# Patient Record
Sex: Male | Born: 2000
Health system: Southern US, Community
[De-identification: ages and names within clinical notes are randomized; demographics above are authoritative.]

## PROBLEM LIST (undated history)

## (undated) DIAGNOSIS — F32A Depression, unspecified: Secondary | ICD-10-CM

## (undated) DIAGNOSIS — F419 Anxiety disorder, unspecified: Secondary | ICD-10-CM

## (undated) DIAGNOSIS — T7840XA Allergy, unspecified, initial encounter: Secondary | ICD-10-CM

## (undated) DIAGNOSIS — F329 Major depressive disorder, single episode, unspecified: Secondary | ICD-10-CM

## (undated) DIAGNOSIS — J45909 Unspecified asthma, uncomplicated: Secondary | ICD-10-CM

## (undated) HISTORY — DX: Unspecified asthma, uncomplicated: J45.909

## (undated) HISTORY — DX: Allergy, unspecified, initial encounter: T78.40XA

## (undated) HISTORY — DX: Major depressive disorder, single episode, unspecified: F32.9

## (undated) HISTORY — DX: Anxiety disorder, unspecified: F41.9

## (undated) HISTORY — DX: Depression, unspecified: F32.A

---
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
## 2020-01-12 DIAGNOSIS — F411 Generalized anxiety disorder: Secondary | ICD-10-CM | POA: Diagnosis not present

## 2020-01-12 DIAGNOSIS — F331 Major depressive disorder, recurrent, moderate: Secondary | ICD-10-CM | POA: Diagnosis not present

## 2020-01-25 IMAGING — US US SCROTUM W/ DOPPLER COMPLETE
1 series · 14 of 25 positions shown · non-contrast
Comparison: None.

CLINICAL DATA: Initial evaluation for acute right testicular pain
for 1 week.

EXAM:
SCROTAL ULTRASOUND
DOPPLER ULTRASOUND OF THE TESTICLES
TECHNIQUE: Complete ultrasound examination of the testicles, epididymis, and
other scrotal structures was performed. Color and spectral Doppler
ultrasound were also utilized to evaluate blood flow to the
testicles.

[Series 1: us scrotum w/ doppler complete · 0.06mm/px · 14 of 59 slices shown]
[im 1/59]
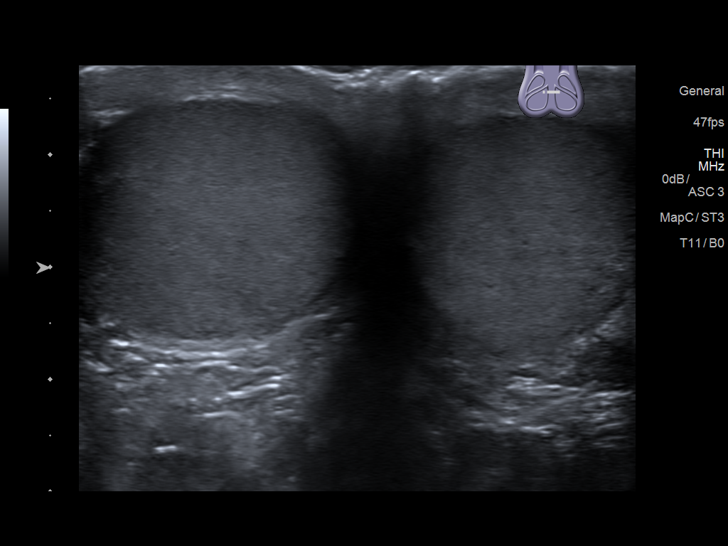
[im 5/59]
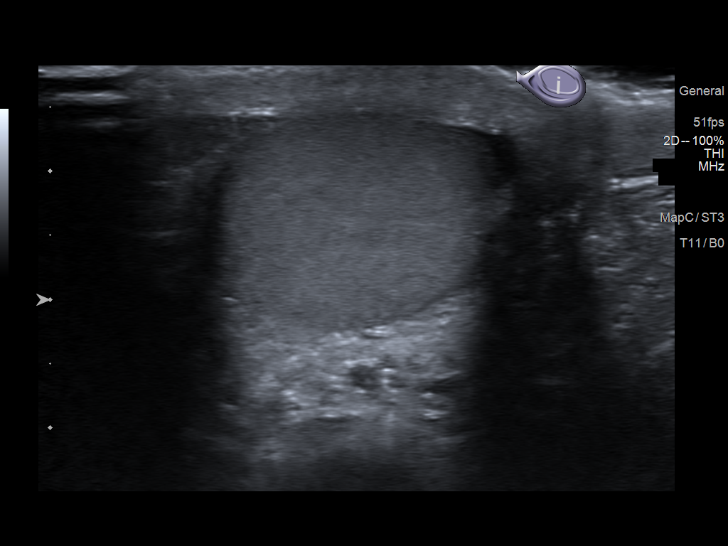
[im 10/59]
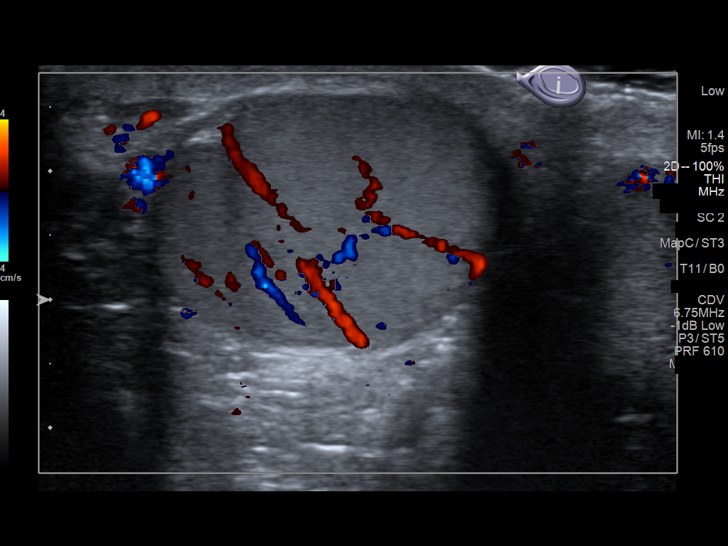
[im 15/59]
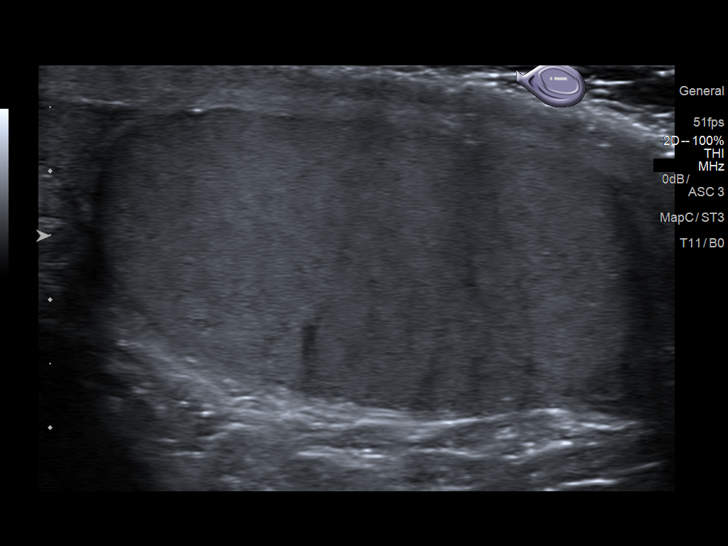
[im 20/59]
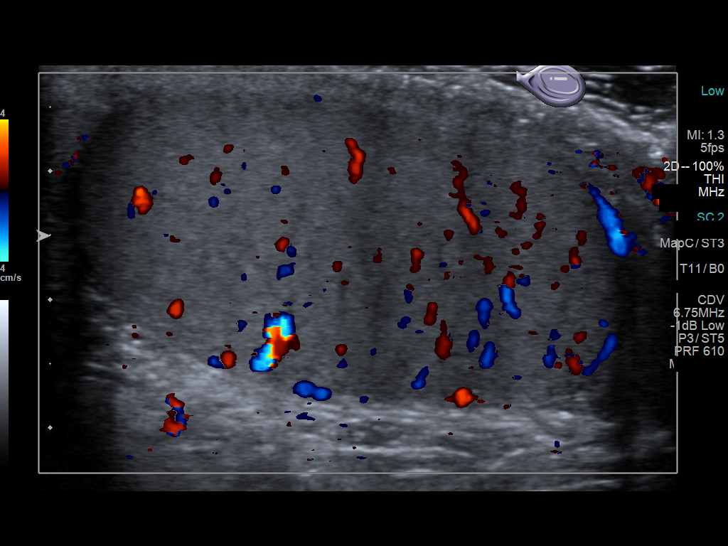
[im 22/59]
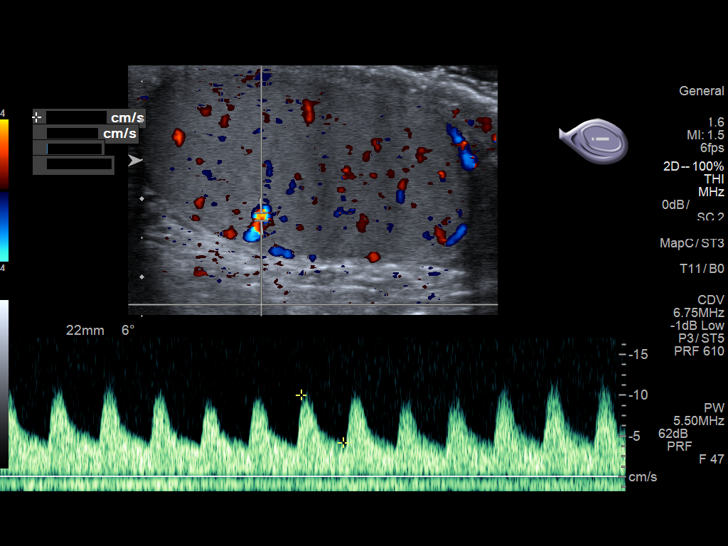
[im 27/59]
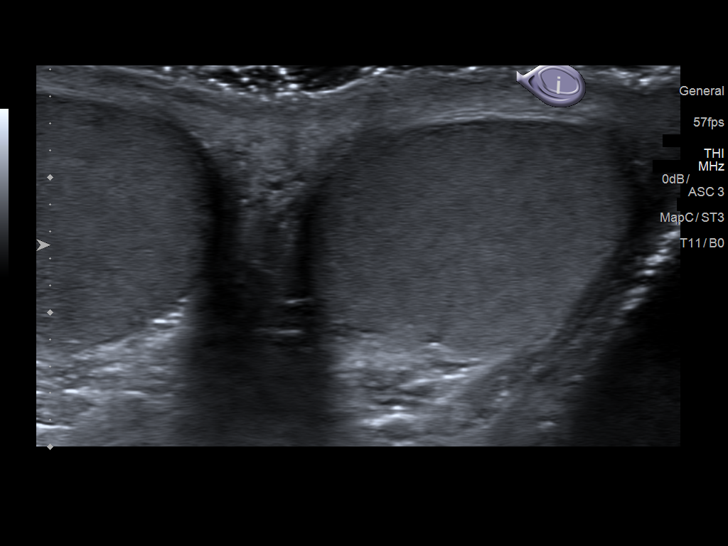
[im 32/59]
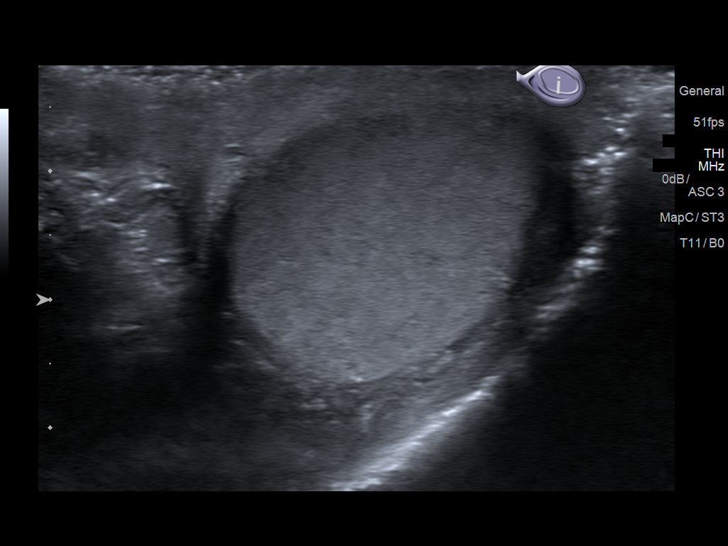
[im 37/59]
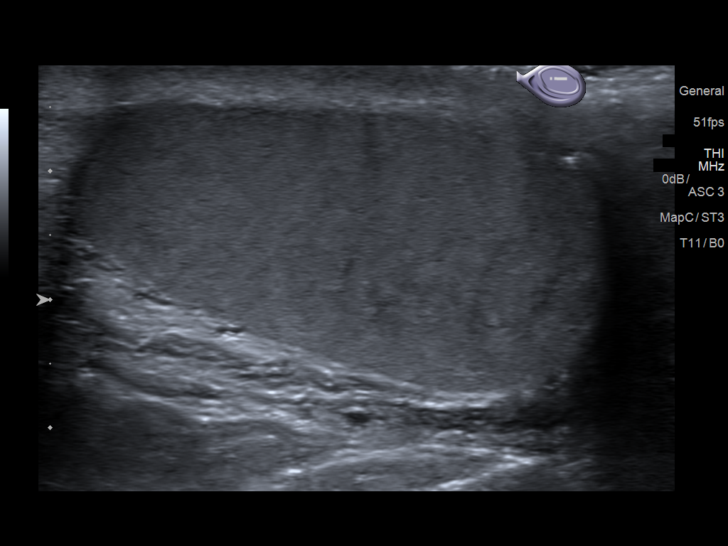
[im 39/59]
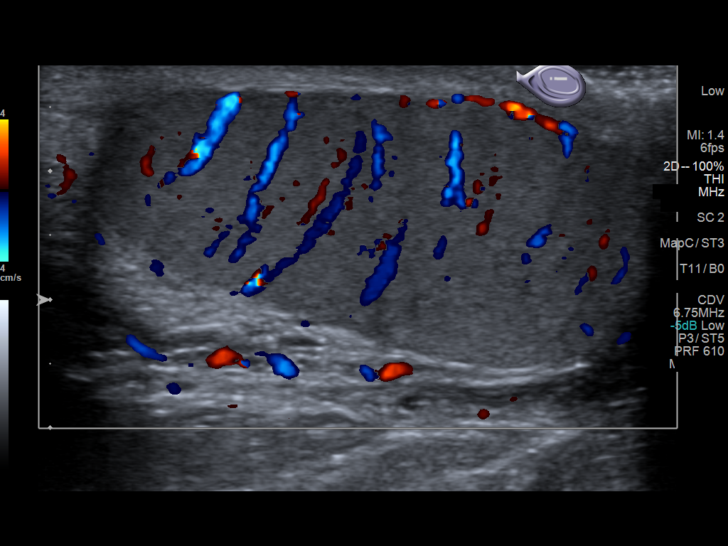
[im 44/59]
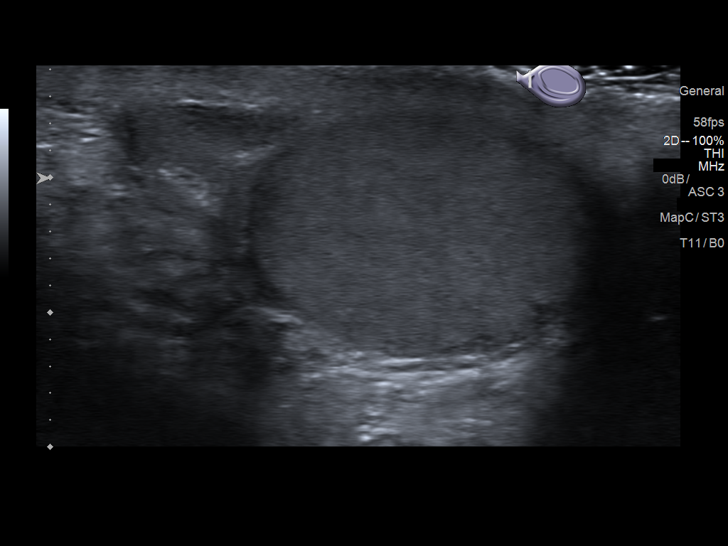
[im 49/59]
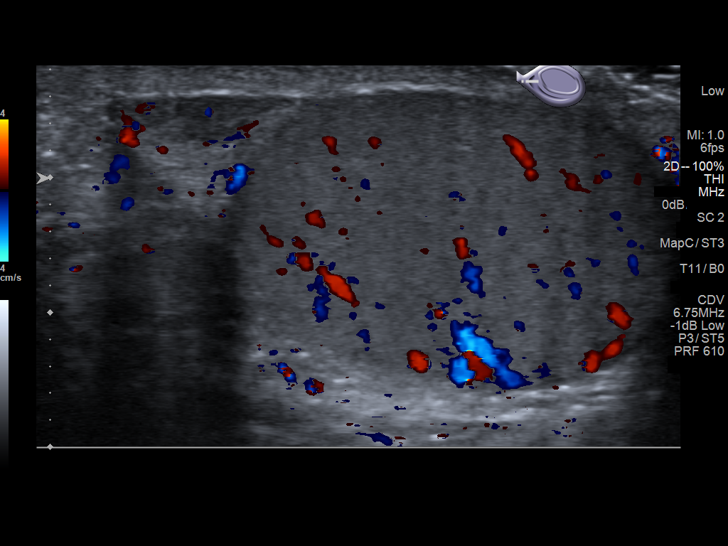
[im 54/59]
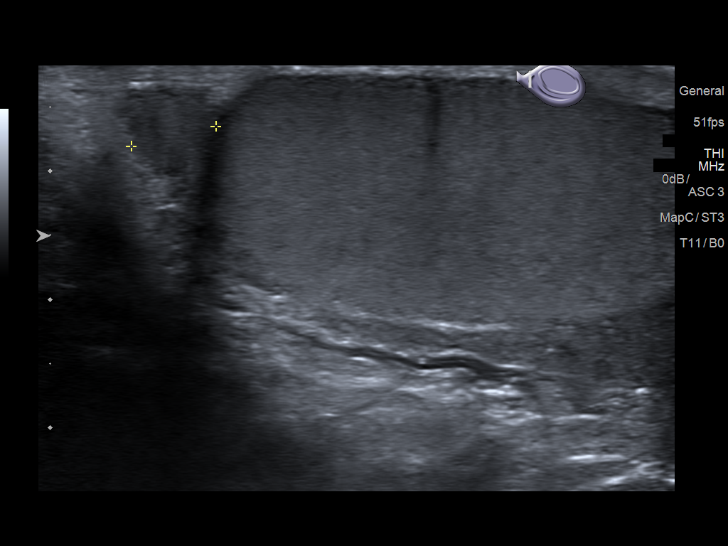
[im 59/59]
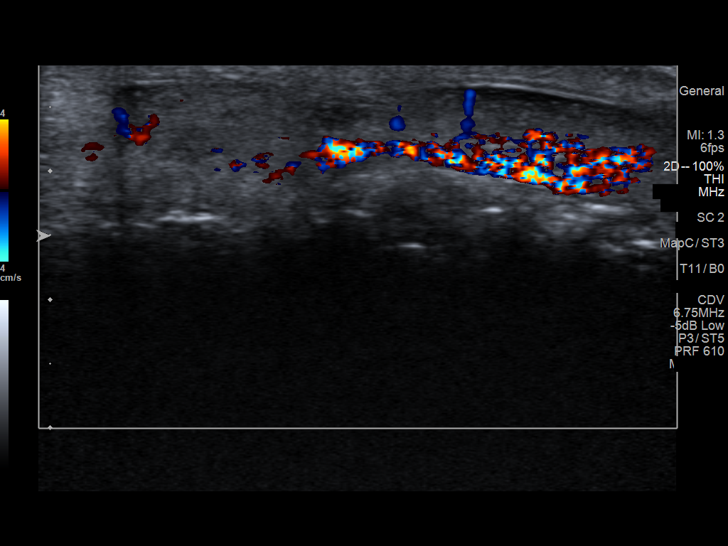

[14 of 25 positions shown; findings below may reference images not displayed]

FINDINGS: Right testicle

Measurements: 4.4 x 2.4 x 2.7 cm. No mass or microlithiasis
visualized.

Left testicle

Measurements: 4.2 x 2.1 x 2.7 cm. No mass or microlithiasis
visualized.

Right epididymis:  Normal in size and appearance.

Left epididymis:  Normal in size and appearance.

Hydrocele:  None visualized.

Varicocele:  None visualized.

Pulsed Doppler interrogation of both testes demonstrates normal low
resistance arterial and venous waveforms bilaterally.
IMPRESSION: Normal scrotal ultrasound.  No acute abnormality identified.

## 2020-03-17 DIAGNOSIS — F339 Major depressive disorder, recurrent, unspecified: Secondary | ICD-10-CM | POA: Diagnosis not present

## 2020-03-17 DIAGNOSIS — F411 Generalized anxiety disorder: Secondary | ICD-10-CM | POA: Diagnosis not present

## 2020-04-12 DIAGNOSIS — H5213 Myopia, bilateral: Secondary | ICD-10-CM | POA: Diagnosis not present

## 2020-10-19 DIAGNOSIS — Z68.41 Body mass index (BMI) pediatric, 85th percentile to less than 95th percentile for age: Secondary | ICD-10-CM | POA: Diagnosis not present

## 2020-10-19 DIAGNOSIS — F329 Major depressive disorder, single episode, unspecified: Secondary | ICD-10-CM | POA: Insufficient documentation

## 2020-10-19 DIAGNOSIS — Z0000 Encounter for general adult medical examination without abnormal findings: Secondary | ICD-10-CM | POA: Diagnosis not present

## 2020-10-19 DIAGNOSIS — F3341 Major depressive disorder, recurrent, in partial remission: Secondary | ICD-10-CM | POA: Diagnosis not present

## 2020-10-25 DIAGNOSIS — N433 Hydrocele, unspecified: Secondary | ICD-10-CM | POA: Diagnosis not present

## 2020-10-25 DIAGNOSIS — N50811 Right testicular pain: Secondary | ICD-10-CM | POA: Diagnosis not present

## 2020-10-30 DIAGNOSIS — N50811 Right testicular pain: Secondary | ICD-10-CM | POA: Diagnosis not present

## 2020-11-06 ENCOUNTER — Encounter: Payer: BC Managed Care – PPO | Admitting: Emergency Medicine

## 2020-11-08 DIAGNOSIS — F333 Major depressive disorder, recurrent, severe with psychotic symptoms: Secondary | ICD-10-CM | POA: Diagnosis not present

## 2020-11-16 DIAGNOSIS — F3341 Major depressive disorder, recurrent, in partial remission: Secondary | ICD-10-CM | POA: Diagnosis not present

## 2020-11-16 DIAGNOSIS — Z68.41 Body mass index (BMI) pediatric, 5th percentile to less than 85th percentile for age: Secondary | ICD-10-CM | POA: Diagnosis not present

## 2020-11-16 DIAGNOSIS — Z0000 Encounter for general adult medical examination without abnormal findings: Secondary | ICD-10-CM | POA: Diagnosis not present

## 2020-11-16 DIAGNOSIS — N50819 Testicular pain, unspecified: Secondary | ICD-10-CM | POA: Diagnosis not present

## 2020-11-16 DIAGNOSIS — N50811 Right testicular pain: Secondary | ICD-10-CM | POA: Diagnosis not present

## 2020-11-16 DIAGNOSIS — Z13220 Encounter for screening for lipoid disorders: Secondary | ICD-10-CM | POA: Diagnosis not present

## 2020-11-23 DIAGNOSIS — F411 Generalized anxiety disorder: Secondary | ICD-10-CM | POA: Diagnosis not present

## 2020-11-23 DIAGNOSIS — F332 Major depressive disorder, recurrent severe without psychotic features: Secondary | ICD-10-CM | POA: Diagnosis not present

## 2020-11-24 DIAGNOSIS — N50811 Right testicular pain: Secondary | ICD-10-CM | POA: Diagnosis not present

## 2020-11-24 DIAGNOSIS — N50819 Testicular pain, unspecified: Secondary | ICD-10-CM | POA: Diagnosis not present

## 2020-11-30 DIAGNOSIS — F411 Generalized anxiety disorder: Secondary | ICD-10-CM | POA: Diagnosis not present

## 2020-11-30 DIAGNOSIS — F332 Major depressive disorder, recurrent severe without psychotic features: Secondary | ICD-10-CM | POA: Diagnosis not present

## 2020-12-05 DIAGNOSIS — F3181 Bipolar II disorder: Secondary | ICD-10-CM | POA: Diagnosis not present

## 2020-12-12 DIAGNOSIS — F3181 Bipolar II disorder: Secondary | ICD-10-CM | POA: Diagnosis not present

## 2020-12-28 DIAGNOSIS — N50811 Right testicular pain: Secondary | ICD-10-CM | POA: Diagnosis not present

## 2020-12-28 DIAGNOSIS — F3181 Bipolar II disorder: Secondary | ICD-10-CM | POA: Diagnosis not present

## 2021-01-24 DIAGNOSIS — F3181 Bipolar II disorder: Secondary | ICD-10-CM | POA: Diagnosis not present

## 2021-01-26 DIAGNOSIS — F3181 Bipolar II disorder: Secondary | ICD-10-CM | POA: Diagnosis not present

## 2021-02-12 DIAGNOSIS — R1031 Right lower quadrant pain: Secondary | ICD-10-CM | POA: Diagnosis not present

## 2021-02-22 DIAGNOSIS — F3181 Bipolar II disorder: Secondary | ICD-10-CM | POA: Diagnosis not present

## 2021-03-26 DIAGNOSIS — F3181 Bipolar II disorder: Secondary | ICD-10-CM | POA: Diagnosis not present

## 2021-03-30 DIAGNOSIS — R1031 Right lower quadrant pain: Secondary | ICD-10-CM | POA: Diagnosis not present

## 2021-04-25 DIAGNOSIS — F3181 Bipolar II disorder: Secondary | ICD-10-CM | POA: Diagnosis not present

## 2021-05-07 ENCOUNTER — Telehealth: Payer: Self-pay | Admitting: Internal Medicine

## 2021-05-07 ENCOUNTER — Encounter: Payer: Self-pay | Admitting: Family Medicine

## 2021-05-07 ENCOUNTER — Telehealth (INDEPENDENT_AMBULATORY_CARE_PROVIDER_SITE_OTHER): Payer: BC Managed Care – PPO | Admitting: Family Medicine

## 2021-05-07 DIAGNOSIS — U071 COVID-19: Secondary | ICD-10-CM

## 2021-05-07 MED ORDER — PAXLOVID 20 X 150 MG & 10 X 100MG PO TBPK: 3.0000 | ORAL_TABLET | Freq: Two times a day (BID) | ORAL | 0 refills | Status: DC

## 2021-05-07 MED ORDER — HYDROCODONE BIT-HOMATROP MBR 5-1.5 MG PO TABS: 1.0000 | ORAL_TABLET | Freq: Three times a day (TID) | ORAL | 0 refills | Status: DC | PRN

## 2021-05-07 NOTE — Telephone Encounter (Addendum)
The patient contacted on call RN and requested to speak to a physician on call.  He stated that he was getting worse with COVID (sick since Saturday).  He was complaining of fever, cough and shortness of breath.  He was advised by RN to go to the emergency room.  He declined since he does not have a ride.  He lives with parents.  He requested me to sent a prescription for Paxlovid.  The patient is not dyspneic on the phone.  He does not sound to be in acute distress.  He had a virtual appointment earlier today.  I will send a paxlovid prescription to his pharmacy.  His parents or someone else will pick it up.  He is aware that the medication will not start working right away.  He was instructed to proceed to ER immediately if worse.  He can call ambulance if he has no ride available.  He understands.

## 2021-05-07 NOTE — Progress Notes (Signed)
[facility] [address] [place] [address] Dept: [phone] Dept Fax: [phone]  Virtual Video Visit  I connected with Melrose Nakayama on 05/07/21 at  4:00 PM EDT by a video enabled telemedicine application and verified that I am speaking with the correct person using two identifiers.  Location patient: Home Location provider: Clinic Persons participating in the virtual visit: Patient, Provider  I discussed the limitations of evaluation and management by telemedicine and the availability of in person appointments. The patient expressed understanding and agreed to proceed.  Chief Complaint  Patient presents with   Follow-up    Pt c/o fever, cough and congestion.    SUBJECTIVE:  HPI: Vincent Barnett is a 20 y.o. male who presents with a 3-day history of  fever (up to 102), rhinorrhea, vomiting (now resolved), and cough. He had a positive COVID test yesterday. He notes that on the weekend, he did experience some DOE when climbing stairs.  He has a past history of asthma, but this has not affected him recently.  Patient Active Problem List   Diagnosis Date Noted   MDD (major depressive disorder) 10/19/2020   History reviewed. No pertinent surgical history.  Family History  Problem Relation Age of Onset   Mental illness Mother    Heart disease Father    Mental illness Sister    Hypertension Maternal Grandmother    Colon cancer Maternal Grandmother    Hyperlipidemia Maternal Grandfather    Diabetes Paternal Grandmother    Hypertension Paternal Grandmother    Stroke Paternal Grandmother    Cancer Paternal Grandmother    Social History   Tobacco Use   Smoking status: Never   Smokeless tobacco: Never  Substance Use Topics   Drug use: Yes    Types: Marijuana    Comment: socially    Current Outpatient Medications:    ARIPiprazole (ABILIFY) 5 MG tablet, Take 5 mg by mouth daily., Disp: , Rfl:     busPIRone (BUSPAR) 15 MG tablet, Take 7.5 mg by mouth 2 (two) times daily., Disp: , Rfl:    escitalopram (LEXAPRO) 10 MG tablet, Take 10 mg by mouth daily., Disp: , Rfl:    HYDROcodone Bit-Homatrop MBr 5-1.5 MG TABS, Take 1 tablet by mouth every 8 (eight) hours as needed., Disp: 15 tablet, Rfl: 0  No Known Allergies  ROS: See pertinent positives and negatives per HPI.  OBSERVATIONS/OBJECTIVE:  VITALS per patient if applicable: There were no vitals filed for this visit. There is no height or weight on file to calculate BMI.   GENERAL: Alert and oriented. Appears well and in no acute distress.  HEENT: Atraumatic. Conjunctiva clear. No obvious abnormalities on inspection of external nose and ears.  NECK: Normal movements of the head and neck.  LUNGS: On inspection, no signs of respiratory distress. Breathing rate appears normal. No obvious gross SOB, gasping or wheezing, and no conversational dyspnea. Occasional coughing spell.  CV: No obvious cyanosis.  MS: Moves all visible extremities without noticeable abnormality.  PSYCH/NEURO: Pleasant and cooperative. No obvious depression or anxiety. Speech and thought processing grossly intact.  ASSESSMENT AND PLAN:  1. COVID-19 Reviewed home care instructions for COVID. Advised self-isolation at home for at least 5 days. After 5 days, if improved and fever resolved, can be in public, but should wear a mask around others for an additional 5 days. Discussed home care for viral illness, including rest, pushing fluids, and OTC medications as needed for symptom relief.  Recommend hot tea with honey for sore throat symptoms. In light of his dyspnea on exertion, I recommend he have someone pick up an oximeter for him. If his oxygen level is below 94%, I recommend he go to urgent care for evaluation.  If symptoms, esp, dyspnea worsens, recommend in-person evaluation at either an urgent care or the emergency room.  - HYDROcodone Bit-Homatrop MBr 5-1.5  MG TABS; Take 1 tablet by mouth every 8 (eight) hours as needed.  Dispense: 15 tablet; Refill: 0  I discussed the assessment and treatment plan with the patient. The patient was provided an opportunity to ask questions and all were answered. The patient agreed with the plan and demonstrated an understanding of the instructions.   The patient was advised to call back or seek an in-person evaluation if the symptoms worsen or if the condition fails to improve as anticipated.   Loyola Mast, MD

## 2021-05-07 NOTE — Patient Instructions (Signed)
10 Things You Can Do to Manage Your COVID-19 Symptoms at Home If you have possible or confirmed COVID-19 Stay home except to get medical care. Monitor your symptoms carefully. If your symptoms get worse, call your healthcare provider immediately. Get rest and stay hydrated. If you have a medical appointment, call the healthcare provider ahead of time and tell them that you have or may have COVID-19. For medical emergencies, call 911 and notify the dispatch personnel that you have or may have COVID-19. Cover your cough and sneezes with a tissue or use the inside of your elbow. Wash your hands often with soap and water for at least 20 seconds or clean your hands with an alcohol-based hand sanitizer that contains at least 60% alcohol. As much as possible, stay in a specific room and away from other people in your home. Also, you should use a separate bathroom, if available. If you need to be around other people in or outside of the home, wear a mask. Avoid sharing personal items with other people in your household, like dishes, towels, and bedding. Clean all surfaces that are touched often, like counters, tabletops, and doorknobs. Use household cleaning sprays or wipes according to the label instructions. cdc.gov/coronavirus 04/14/2020 This information is not intended to replace advice given to you by your health care provider. Make sure you discuss any questions you have with your healthcare provider. Document Revised: 11/03/2020 Document Reviewed: 11/03/2020 Elsevier Patient Education  2022 Elsevier Inc.  

## 2021-05-08 ENCOUNTER — Telehealth: Payer: Self-pay | Admitting: Emergency Medicine

## 2021-05-08 NOTE — Telephone Encounter (Signed)
05/07/2021.

## 2022-01-26 ENCOUNTER — Emergency Department (HOSPITAL_COMMUNITY): Payer: Commercial Managed Care - HMO

## 2022-01-26 ENCOUNTER — Encounter (HOSPITAL_COMMUNITY): Payer: Self-pay

## 2022-01-26 ENCOUNTER — Other Ambulatory Visit: Payer: Self-pay

## 2022-01-26 ENCOUNTER — Emergency Department (HOSPITAL_COMMUNITY)
Admission: EM | Admit: 2022-01-26 | Discharge: 2022-01-30 | Disposition: A | Discharge status: Home / Self Care | Payer: Commercial Managed Care - HMO | Attending: Emergency Medicine | Admitting: Emergency Medicine

## 2022-01-26 DIAGNOSIS — X820XXA Intentional collision of motor vehicle with other motor vehicle, initial encounter: Secondary | ICD-10-CM | POA: Diagnosis not present

## 2022-01-26 DIAGNOSIS — T1491XA Suicide attempt, initial encounter: Secondary | ICD-10-CM | POA: Diagnosis not present

## 2022-01-26 DIAGNOSIS — D72829 Elevated white blood cell count, unspecified: Secondary | ICD-10-CM | POA: Insufficient documentation

## 2022-01-26 DIAGNOSIS — U071 COVID-19: Secondary | ICD-10-CM | POA: Diagnosis not present

## 2022-01-26 DIAGNOSIS — Y900 Blood alcohol level of less than 20 mg/100 ml: Secondary | ICD-10-CM | POA: Insufficient documentation

## 2022-01-26 DIAGNOSIS — F332 Major depressive disorder, recurrent severe without psychotic features: Secondary | ICD-10-CM | POA: Diagnosis present

## 2022-01-26 DIAGNOSIS — F329 Major depressive disorder, single episode, unspecified: Secondary | ICD-10-CM | POA: Diagnosis present

## 2022-01-26 LAB — COMPREHENSIVE METABOLIC PANEL
ALT: 27 U/L (ref 0–44)
AST: 30 U/L (ref 15–41)
Albumin: 4.6 g/dL (ref 3.5–5.0)
Alkaline Phosphatase: 54 U/L (ref 38–126)
Anion gap: 7 (ref 5–15)
BUN: 10 mg/dL (ref 6–20)
CO2: 26 mmol/L (ref 22–32)
Calcium: 9.2 mg/dL (ref 8.9–10.3)
Chloride: 107 mmol/L (ref 98–111)
Creatinine, Ser: 1.22 mg/dL (ref 0.61–1.24)
GFR, Estimated: >60 mL/min (ref >60)
Glucose, Bld: 96 mg/dL (ref 70–99)
Potassium: 3.7 mmol/L (ref 3.5–5.1)
Sodium: 140 mmol/L (ref 135–145)
Total Bilirubin: 1.6 mg/dL — ABNORMAL HIGH (ref 0.3–1.2)
Total Protein: 7.6 g/dL (ref 6.5–8.1)

## 2022-01-26 LAB — CBC WITH DIFFERENTIAL/PLATELET
Abs Immature Granulocytes: 0.05 10*3/uL (ref 0.00–0.07)
Basophils Absolute: 0 10*3/uL (ref 0.0–0.1)
Basophils Relative: 0 %
Eosinophils Absolute: 0.1 10*3/uL (ref 0.0–0.5)
Eosinophils Relative: 1 %
HCT: 45 % (ref 39.0–52.0)
Hemoglobin: 15 g/dL (ref 13.0–17.0)
Immature Granulocytes: 0 %
Lymphocytes Relative: 4 %
Lymphs Abs: 0.5 10*3/uL — ABNORMAL LOW (ref 0.7–4.0)
MCH: 31.4 pg (ref 26.0–34.0)
MCHC: 33.3 g/dL (ref 30.0–36.0)
MCV: 94.3 fL (ref 80.0–100.0)
Monocytes Absolute: 1 10*3/uL (ref 0.1–1.0)
Monocytes Relative: 8 %
Neutro Abs: 11.2 10*3/uL — ABNORMAL HIGH (ref 1.7–7.7)
Neutrophils Relative %: 87 %
Platelets: 282 10*3/uL (ref 150–400)
RBC: 4.77 MIL/uL (ref 4.22–5.81)
RDW: 11.7 % (ref 11.5–15.5)
WBC: 12.8 10*3/uL — ABNORMAL HIGH (ref 4.0–10.5)
nRBC: 0 % (ref 0.0–0.2)

## 2022-01-26 LAB — SALICYLATE LEVEL: Salicylate Lvl: <7 mg/dL — ABNORMAL LOW (ref 7.0–30.0)

## 2022-01-26 LAB — ETHANOL: Alcohol, Ethyl (B): <10 mg/dL (ref <10)

## 2022-01-26 LAB — RAPID URINE DRUG SCREEN, HOSP PERFORMED
Amphetamines: NOT DETECTED
Barbiturates: NOT DETECTED
Benzodiazepines: NOT DETECTED
Cocaine: NOT DETECTED
Opiates: NOT DETECTED
Tetrahydrocannabinol: POSITIVE — AB

## 2022-01-26 LAB — ACETAMINOPHEN LEVEL: Acetaminophen (Tylenol), Serum: <10 ug/mL — ABNORMAL LOW (ref 10–30)
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
## 2022-01-27 LAB — RESP PANEL BY RT-PCR (FLU A&B, COVID) ARPGX2
Influenza A by PCR: NEGATIVE
Influenza B by PCR: NEGATIVE
SARS Coronavirus 2 by RT PCR: POSITIVE — AB

## 2022-01-27 LAB — LITHIUM LEVEL: Lithium Lvl: <0.06 mmol/L — ABNORMAL LOW (ref 0.60–1.20)

## 2022-01-27 MED ORDER — ARIPIPRAZOLE 5 MG PO TABS
5.0000 mg | ORAL_TABLET | Freq: Every day | ORAL | Status: DC
  Administered 2022-01-27 – 2022-01-30 (×4): 5 mg via ORAL
  Filled 2022-01-27 (×4): qty 1

## 2022-01-27 MED ORDER — LITHIUM CARBONATE ER 300 MG PO TBCR
300.0000 mg | EXTENDED_RELEASE_TABLET | Freq: Every day | ORAL | Status: DC
  Administered 2022-01-27 – 2022-01-30 (×4): 300 mg via ORAL
  Filled 2022-01-27 (×4): qty 1

## 2022-01-27 MED ORDER — ESCITALOPRAM OXALATE 10 MG PO TABS
20.0000 mg | ORAL_TABLET | Freq: Every day | ORAL | Status: DC
  Administered 2022-01-27 – 2022-01-30 (×4): 20 mg via ORAL
  Filled 2022-01-27 (×4): qty 2

## 2022-01-27 MED ORDER — CLONAZEPAM 0.125 MG PO TBDP
0.2500 mg | ORAL_TABLET | Freq: Every day | ORAL | Status: DC | PRN
  Administered 2022-01-27 – 2022-01-29 (×2): 0.5 mg via ORAL
  Filled 2022-01-27 (×2): qty 4

## 2022-01-27 MED ORDER — BUSPIRONE HCL 5 MG PO TABS
7.5000 mg | ORAL_TABLET | Freq: Two times a day (BID) | ORAL | Status: DC
  Administered 2022-01-27 – 2022-01-30 (×6): 7.5 mg via ORAL
  Filled 2022-01-27 (×7): qty 1.5

## 2022-01-27 MED ORDER — ACETAMINOPHEN 325 MG PO TABS
650.0000 mg | ORAL_TABLET | Freq: Four times a day (QID) | ORAL | Status: DC | PRN
  Administered 2022-01-27 (×2): 650 mg via ORAL
  Filled 2022-01-27 (×2): qty 2

## 2022-01-27 MED ORDER — GUAIFENESIN-DM 100-10 MG/5ML PO SYRP
5.0000 mL | ORAL_SOLUTION | ORAL | Status: DC | PRN
  Filled 2022-01-27: qty 10
  Filled 2022-01-27: qty 5
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
## 2022-01-27 NOTE — ED Notes (Signed)
Breakfast tray offered. Pt did not want it at this time.  ?

## 2022-01-28 DIAGNOSIS — F332 Major depressive disorder, recurrent severe without psychotic features: Secondary | ICD-10-CM | POA: Diagnosis present

## 2022-01-28 DIAGNOSIS — T1491XA Suicide attempt, initial encounter: Secondary | ICD-10-CM | POA: Insufficient documentation

## 2022-01-28 MED ORDER — TRAZODONE HCL 100 MG PO TABS
100.0000 mg | ORAL_TABLET | Freq: Every day | ORAL | Status: DC
  Administered 2022-01-29 (×2): 100 mg via ORAL
  Filled 2022-01-28 (×3): qty 1

## 2022-01-28 MED ORDER — LORAZEPAM 1 MG PO TABS
1.0000 mg | ORAL_TABLET | Freq: Once | ORAL | Status: AC
  Administered 2022-01-28: 1 mg via ORAL
  Filled 2022-01-28: qty 1
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
## 2022-01-29 DIAGNOSIS — T1491XA Suicide attempt, initial encounter: Secondary | ICD-10-CM

## 2022-01-29 MED ORDER — ONDANSETRON 4 MG PO TBDP
4.0000 mg | ORAL_TABLET | Freq: Three times a day (TID) | ORAL | Status: DC | PRN
  Administered 2022-01-29: 4 mg via ORAL
  Filled 2022-01-29: qty 1

## 2022-01-29 MED ORDER — LORAZEPAM 1 MG PO TABS
1.0000 mg | ORAL_TABLET | Freq: Four times a day (QID) | ORAL | Status: DC | PRN
Start: 2022-01-29 — End: 2022-01-30
  Administered 2022-01-29: 1 mg via ORAL
  Filled 2022-01-29: qty 1

## 2022-01-29 NOTE — Consult Note (Signed)
[facility] Psych ED Progress Note ? ?01/29/2022 12:21 PM ?Vincent Barnett  ?MRN:  [id number] ? ? ?Method of visit?: Face to Face  ? ?Subjective:  21 y.o.  Caucasian male with  past psychiatric history significant for major depressive disorder  and anxiety  presented to [facility] ED, accompanied by his mother Rodolfo Gaster), after attempting to kill himself after he got in the middle of his parents arguing.    ?He was seen in his  room resting.  Nursing reported no behavioral issues noted.  He continues to take his medications and today he reported that he has had time to reflect on what he did and if he could have done things differently.  He is very much interested in family therapy.  Provider spoke to his mother this morning regarding possible discharge tomorrow and the need to engage in Family therapy.  Mom is also much interested in that.  He denied SI/HI/AVH and no mention of paranoia. ?Principal Problem: Major depressive disorder, recurrent severe without psychotic features (HCC) ?Diagnosis:  Principal Problem: ?  Major depressive disorder, recurrent severe without psychotic features (HCC) ?Active Problems: ?  MDD (major depressive disorder) ? ?Total Time spent with patient: 20 minutes ? ?Past Psychiatric History: Depression, anxiety ? ?Past Medical History:  ?Past Medical History:  ?Diagnosis Date  ? Allergy   ? Anxiety   ? Asthma   ? Depression   ? History reviewed. No pertinent surgical history. ?Family History:  ?Family History  ?Problem Relation Age of Onset  ? Mental illness Mother   ? Heart disease Father   ? Mental illness Sister   ? Hypertension Maternal Grandmother   ? Colon cancer Maternal Grandmother   ? Hyperlipidemia Maternal Grandfather   ? Diabetes Paternal Grandmother   ? Hypertension Paternal Grandmother   ? Stroke Paternal Grandmother   ? Cancer Paternal Grandmother   ? ?Family Psychiatric  History: Mental illness in his mother and sister ?Social History:  ?Social History  ? ?Substance and  Sexual Activity  ?Alcohol Use None  ?   ?Social History  ? ?Substance and Sexual Activity  ?Drug Use Yes  ? Types: Marijuana  ? Comment: socially  ?  ?Social History  ? ?Socioeconomic History  ? Marital status: Single  ?  Spouse name: Not on file  ? Number of children: Not on file  ? Years of education: Not on file  ? Highest education level: Not on file  ?Occupational History  ? Not on file  ?Tobacco Use  ? Smoking status: Never  ? Smokeless tobacco: Never  ?Substance and Sexual Activity  ? Alcohol use: Not on file  ? Drug use: Yes  ?  Types: Marijuana  ?  Comment: socially  ? Sexual activity: Not on file  ?Other Topics Concern  ? Not on file  ?Social History Narrative  ? Not on file  ? ?Social Determinants of Health  ? ?Financial Resource Strain: Not on file  ?Food Insecurity: Not on file  ?Transportation Needs: Not on file  ?Physical Activity: Not on file  ?Stress: Not on file  ?Social Connections: Not on file  ? ? ?Sleep: Good ? ?Appetite:  Good ? ?Current Medications: ?Current Facility-Administered Medications  ?Medication Dose Route Frequency Provider Last Rate Last Admin  ? acetaminophen (TYLENOL) tablet 650 mg  650 mg Oral Q6H PRN Gilda Crease, MD   650 mg at 01/27/22 1228  ? ARIPiprazole (ABILIFY) tablet 5 mg  5 mg Oral Daily Tanda Rockers  A, DO   5 mg at 01/29/22 0950  ? busPIRone (BUSPAR) tablet 7.5 mg  7.5 mg Oral BID Tanda Rockers A, DO   7.5 mg at 01/29/22 0840  ? clonazepam (KLONOPIN) disintegrating tablet 0.25-0.5 mg  0.25-0.5 mg Oral Daily PRN Tanda Rockers A, DO   0.5 mg at 01/27/22 2013  ? escitalopram (LEXAPRO) tablet 20 mg  20 mg Oral Daily Tanda Rockers A, DO   20 mg at 01/29/22 4128  ? guaiFENesin-dextromethorphan (ROBITUSSIN DM) 100-10 MG/5ML syrup 5 mL  5 mL Oral Q4H PRN Tanda Rockers A, DO      ? lithium carbonate (LITHOBID) CR tablet 300 mg  300 mg Oral Daily Tanda Rockers A, DO   300 mg at 01/29/22 0950  ? LORazepam (ATIVAN) tablet 1 mg  1 mg Oral Q6H PRN Zadie Rhine, MD   1 mg  at 01/29/22 0043  ? ondansetron (ZOFRAN-ODT) disintegrating tablet 4 mg  4 mg Oral Q8H PRN Zadie Rhine, MD   4 mg at 01/29/22 0246  ? traZODone (DESYREL) tablet 100 mg  100 mg Oral QHS Molpus, John, MD   100 mg at 01/29/22 0022  ? ?Current Outpatient Medications  ?Medication Sig Dispense Refill  ? ARIPiprazole (ABILIFY) 5 MG tablet Take 5 mg by mouth daily.    ? busPIRone (BUSPAR) 15 MG tablet Take 7.5 mg by mouth 2 (two) times daily.    ? clonazePAM (KLONOPIN) 0.5 MG tablet Take 0.25-0.5 mg by mouth daily as needed for anxiety.    ? escitalopram (LEXAPRO) 20 MG tablet Take 20 mg by mouth daily.    ? lithium 300 MG tablet Take 300 mg by mouth daily.    ? traZODone (DESYREL) 50 MG tablet Take 50-100 mg by mouth at bedtime as needed for sleep.    ? ? ?Lab Results:  ?Results for orders placed or performed during the hospital encounter of 01/26/22 (from the past 48 hour(s))  ?Lithium level     Status: Abnormal  ? Collection Time: 01/27/22  4:52 PM  ?Result Value Ref Range  ? Lithium Lvl <0.06 (L) 0.60 - 1.20 mmol/L  ?  Comment: Performed at [facility], [address], [place], [place] [address]  ? ? ?Blood Alcohol level:  ?Lab Results  ?Component Value Date  ? ETH <10 01/26/2022  ? ? ?Physical Findings: ?AIMS:  , ,  ,  ,    ?CIWA:    ?COWS:    ? ?Musculoskeletal: ?Strength & Muscle Tone: within normal limits ?Gait & Station: normal ?Patient leans: Front ? ?Psychiatric Specialty Exam: ? ?Presentation  ?General Appearance: Appropriate for Environment; Casual; Fairly Groomed ? ?Eye Contact:Good ? ?Speech:Clear and Coherent; Normal Rate ? ?Speech Volume:Normal ? ?Handedness:Right ? ? ?Mood and Affect  ?Mood:Depressed (Depressed mood is improving some.) ? ?Affect:Congruent ? ? ?Thought Process  ?Thought Processes:Coherent; Goal Directed ? ?Descriptions of Associations:Intact ? ?Orientation:Full (Time, Place and Person) ? ?Thought Content:Logical ? ?History of Schizophrenia/Schizoaffective  disorder:No ? ?Duration of Psychotic Symptoms:No data recorded ?Hallucinations:Hallucinations: None ? ? ?Ideas of Reference:None ? ?Suicidal Thoughts:Suicidal Thoughts: No ? ? ?Homicidal Thoughts:Homicidal Thoughts: No ? ? ? ?Sensorium  ?Memory:Immediate Good; Recent Good; Remote Good ? ?Judgment:Fair ? ?Insight:Good ? ? ?Executive Functions  ?Concentration:Good ? ?Attention Span:Good ? ?Recall:Good ? ?Fund of Knowledge:Good ? ?Language:Good ? ? ?Psychomotor Activity  ?Psychomotor Activity:Psychomotor Activity: Normal ? ? ? ?Assets  ?Assets:Communication Skills; Desire for Improvement; Housing; Physical Health ? ? ?Sleep  ?Sleep:Sleep: Good ? ? ? ? ?  Physical Exam: ?Physical Exam ?Vitals and nursing note reviewed.  ?Constitutional:   ?   Appearance: Normal appearance.  ?HENT:  ?   Head: Normocephalic and atraumatic.  ?   Nose: Nose normal.  ?Pulmonary:  ?   Effort: Pulmonary effort is normal.  ?   Breath sounds: No rales.  ?Musculoskeletal:     ?   General: Normal range of motion.  ?   Cervical back: Normal range of motion.  ?Skin: ?   General: Skin is warm and dry.  ?Neurological:  ?   General: No focal deficit present.  ?   Mental Status: He is alert and oriented to person, place, and time.  ? ?Review of Systems  ?Constitutional: Negative.   ?HENT: Negative.    ?Respiratory: Negative.    ?Cardiovascular: Negative.   ?Gastrointestinal: Negative.   ?Genitourinary: Negative.   ?Musculoskeletal: Negative.   ?Skin: Negative.   ?Neurological: Negative.   ?Endo/Heme/Allergies: Negative.   ?Psychiatric/Behavioral:  Positive for depression. The patient is nervous/anxious.   ?Blood pressure 133/85, pulse (!) 104, temperature 98.5 ?F (36.9 ?C), temperature source Oral, resp. rate 18, SpO2 99 %. There is no height or weight on file to calculate BMI. ? ?Treatment Plan Summary: Patient is improving, plan is to monitor overnight and discharge home.  He has been compliant with his medications . ?Both patient and mother agrees to  family therapy. ? ?Earney NavyJosephine C Ashlley Booher, NP-PMHNP-BC ?01/29/2022, 12:21 PM ? ? ?

## 2022-01-29 NOTE — ED Provider Notes (Signed)
Emergency Medicine Observation Re-evaluation Note ? ?Vincent Barnett Barnett is a 21 y.o. male, seen on rounds today.  Pt initially presented to the ED for complaints of Suicidal ?Currently, the patient is resting, alert and awake. ? ?Physical Exam  ?BP (!) 128/92 (BP Location: Left Arm)   Pulse 96   Temp 98.6 ?F (37 ?C) (Oral)   Resp 20   SpO2 99%  ?Physical Exam ?General: NAD ?Cardiac: well perfused ?Abd: Soft, nondistended ?Lungs: even and unlabored, lungs CTAB ?Psych: no agitation ? ?ED Course / MDM  ?EKG:  ? ?I have reviewed the labs performed to date as well as medications administered while in observation.  Recent changes in the last 24 hours include evaluated by psychiatry yesterday and determined to need inpatient admission. ? ?Of note, patient is COVID positive. Home medications have been resumed. ? ?The patient states "I feel like this experience has worsened my depression. I feel like Im being treated like an animal in here. I haven't had anything to eat in a couple days." I apologized and explained to the patient the process of placement in an inpatient facility and explained that we are still waiting on a bed. I explained to the patient the necessity of removing personal belongings from the room. He endorsed acknowledgement and was agreeable and cooperative, if mildly anxious appearing and with a depressed mood. The patient has been experiencing symptoms of nausea for the past five days. He denies any dyspnea and is overall well appearing. He has Zofran ODT ordered. Abdomen is soft and non-distended and he is in no discomfort. Offered him PRN Zofran which he politely declined. Encouraged continued oral rehydration as tolerated.  ? ?Plan  ?Current plan is for inpatient admission. Social work Corporate investment banker. ? Vincent Barnett is not under involuntary commitment. ? ? ?  ?Regan Lemming, MD ?01/29/22 [id number] ? ?

## 2022-01-30 MED ORDER — TRAZODONE HCL 100 MG PO TABS: 100.0000 mg | ORAL_TABLET | Freq: Every day | ORAL | 0 refills | Status: DC

## 2022-01-30 NOTE — ED Notes (Signed)
Patient calm and cooperative throughout the night.  ?

## 2022-01-30 NOTE — Discharge Instructions (Addendum)
For your behavioral health needs you are advised to continue treatment with your current outpatient provider.  Ask about seeing a therapist as well as your current psychiatry provider: ? ?     [facility] ?     [address] ?     [place], [place] [address] ?     [phone] ? ?As an alternative for therapy, contact one of the providers listed below: ? ?     [facility] ?     [address] ?     [place], [place] [address] ?     [phone]  ? ?     [facility] ?     [address] ?     [place], [place] [address] ?     [phone]  ?

## 2022-01-30 NOTE — BH Assessment (Signed)
[facility] Assessment Progress Note ?  ?Per Dahlia Byes, NP, this voluntary pt does not require psychiatric hospitalization at this time.  Pt is psychiatrically cleared.  Discharge instructions advise pt to continue treatment with his current psychiatry provider, and recommend that he also see a therapist with several referrals offered.  EDP Lorre Nick, MD and pt's nurse, Waynetta Sandy, have been notified. ? ?Doylene Canning, MA ?Triage Specialist ?[phone] ? ?

## 2022-01-30 NOTE — Consult Note (Signed)
[facility] Discharge ? ?01/30/2022 9:42 AM ?Vincent Barnett  ?MRN:  [id number] ? ?Method of visit?: Face to Face  ? ?Principal Problem: Major depressive disorder, recurrent severe without psychotic features (HCC) ?Discharge Diagnoses: Principal Problem: ?  Major depressive disorder, recurrent severe without psychotic features (HCC) ?Active Problems: ?  MDD (major depressive disorder) ? ? ?Subjective: 21 y.o.  Caucasian male with  past psychiatric history significant for major depressive disorder  and anxiety  presented to [facility] ED, accompanied by his mother Vincent Barnett), after attempting to kill himself after he got in the middle of his parents arguing. ?Patient was accepted for inpatient Psychiatric hospitalization for safety and stabilization.  While waiting for available bed we resumed home medications.  We obtained lab work and rounded on patient daily.  We monitored him 1:1  and attended to his need.  He spent good amount of time with his mother sitting in the hospital room .  He tested positive for Covid on arrival to the ER and that made it difficult to get a bed.  He has since improved, denied SI/HI/AVH and no mention of paranoia.  Patient reported that he had time to think about the reason why he landed in the ER.  He agrees to family counseling stating his parents not getting along affects him.  Again he denied  feeling suicidal and is discharged.  Patient will continue taking his medications and will follow up with his Psychiatrist. ? ?Total Time spent with patient: 30 minutes ? ?Past Psychiatric History: See initial consult note ? ?Past Medical History:  ?Past Medical History:  ?Diagnosis Date  ? Allergy   ? Anxiety   ? Asthma   ? Depression   ? History reviewed. No pertinent surgical history. ?Family History:  ?Family History  ?Problem Relation Age of Onset  ? Mental illness Mother   ? Heart disease Father   ? Mental illness Sister   ? Hypertension Maternal Grandmother   ? Colon cancer  Maternal Grandmother   ? Hyperlipidemia Maternal Grandfather   ? Diabetes Paternal Grandmother   ? Hypertension Paternal Grandmother   ? Stroke Paternal Grandmother   ? Cancer Paternal Grandmother   ? ?Family Psychiatric  History: see initial Psychiatric note ?Social History:  ?Social History  ? ?Substance and Sexual Activity  ?Alcohol Use None  ?   ?Social History  ? ?Substance and Sexual Activity  ?Drug Use Yes  ? Types: Marijuana  ? Comment: socially  ?  ?Social History  ? ?Socioeconomic History  ? Marital status: Single  ?  Spouse name: Not on file  ? Number of children: Not on file  ? Years of education: Not on file  ? Highest education level: Not on file  ?Occupational History  ? Not on file  ?Tobacco Use  ? Smoking status: Never  ? Smokeless tobacco: Never  ?Substance and Sexual Activity  ? Alcohol use: Not on file  ? Drug use: Yes  ?  Types: Marijuana  ?  Comment: socially  ? Sexual activity: Not on file  ?Other Topics Concern  ? Not on file  ?Social History Narrative  ? Not on file  ? ?Social Determinants of Health  ? ?Financial Resource Strain: Not on file  ?Food Insecurity: Not on file  ?Transportation Needs: Not on file  ?Physical Activity: Not on file  ?Stress: Not on file  ?Social Connections: Not on file  ? ? ?Tobacco Cessation:  N/A, patient does not currently use tobacco  products ? ?Current Medications: ?Current Facility-Administered Medications  ?Medication Dose Route Frequency Provider Last Rate Last Admin  ? acetaminophen (TYLENOL) tablet 650 mg  650 mg Oral Q6H PRN Gilda CreasePollina, Christopher J, MD   650 mg at 01/27/22 1228  ? ARIPiprazole (ABILIFY) tablet 5 mg  5 mg Oral Daily Tanda RockersGray, Samuel A, DO   5 mg at 01/30/22 [id number]  ? busPIRone (BUSPAR) tablet 7.5 mg  7.5 mg Oral BID Tanda RockersGray, Samuel A, DO   7.5 mg at 01/30/22 [id number]  ? clonazepam (KLONOPIN) disintegrating tablet 0.25-0.5 mg  0.25-0.5 mg Oral Daily PRN Tanda RockersGray, Samuel A, DO   0.5 mg at 01/29/22 2017  ? escitalopram (LEXAPRO) tablet 20 mg  20 mg Oral Daily  Tanda RockersGray, Samuel A, DO   20 mg at 01/30/22 [id number]  ? guaiFENesin-dextromethorphan (ROBITUSSIN DM) 100-10 MG/5ML syrup 5 mL  5 mL Oral Q4H PRN Tanda RockersGray, Samuel A, DO      ? lithium carbonate (LITHOBID) CR tablet 300 mg  300 mg Oral Daily Tanda RockersGray, Samuel A, DO   300 mg at 01/30/22 [id number]  ? LORazepam (ATIVAN) tablet 1 mg  1 mg Oral Q6H PRN Zadie RhineWickline, Donald, MD   1 mg at 01/29/22 0043  ? ondansetron (ZOFRAN-ODT) disintegrating tablet 4 mg  4 mg Oral Q8H PRN Zadie RhineWickline, Donald, MD   4 mg at 01/29/22 0246  ? traZODone (DESYREL) tablet 100 mg  100 mg Oral QHS Molpus, John, MD   100 mg at 01/29/22 2013  ? ?Current Outpatient Medications  ?Medication Sig Dispense Refill  ? ARIPiprazole (ABILIFY) 5 MG tablet Take 5 mg by mouth daily.    ? busPIRone (BUSPAR) 15 MG tablet Take 7.5 mg by mouth 2 (two) times daily.    ? clonazePAM (KLONOPIN) 0.5 MG tablet Take 0.25-0.5 mg by mouth daily as needed for anxiety.    ? escitalopram (LEXAPRO) 20 MG tablet Take 20 mg by mouth daily.    ? lithium 300 MG tablet Take 300 mg by mouth daily.    ? traZODone (DESYREL) 100 MG tablet Take 1 tablet (100 mg total) by mouth at bedtime. 30 tablet 0  ? ?PTA Medications: ?(Not in a hospital admission) ? ? ?Musculoskeletal: ?Strength & Muscle Tone: within normal limits ?Gait & Station: normal ?Patient leans: Front ? ?Psychiatric Specialty Exam: ? ?Presentation  ?General Appearance: Appropriate for Environment; Casual; Fairly Groomed ? ?Eye Contact:Good ? ?Speech:Clear and Coherent; Normal Rate ? ?Speech Volume:Normal ? ?Handedness:Right ? ? ?Mood and Affect  ?Mood:Depressed (Depressed mood is improving some.) ? ?Affect:Congruent ? ? ?Thought Process  ?Thought Processes:Coherent; Goal Directed ? ?Descriptions of Associations:Intact ? ?Orientation:Full (Time, Place and Person) ? ?Thought Content:Logical ? ?History of Schizophrenia/Schizoaffective disorder:No ? ?Duration of Psychotic Symptoms:No data recorded ?Hallucinations:Hallucinations: None ? ?Ideas of  Reference:None ? ?Suicidal Thoughts:Suicidal Thoughts: No ? ?Homicidal Thoughts:Homicidal Thoughts: No ? ? ?Sensorium  ?Memory:Immediate Good; Recent Good; Remote Good ? ?Judgment:Fair ? ?Insight:Good ? ? ?Executive Functions  ?Concentration:Good ? ?Attention Span:Good ? ?Recall:Good ? ?Fund of Knowledge:Good ? ?Language:Good ? ? ?Psychomotor Activity  ?Psychomotor Activity:Psychomotor Activity: Normal ? ? ?Assets  ?Assets:Communication Skills; Desire for Improvement; Housing; Physical Health ? ? ?Sleep  ?Sleep:Sleep: Good ? ? ? ?Physical Exam: ?Physical Exam ?Vitals and nursing note reviewed.  ?Constitutional:   ?   Appearance: Normal appearance.  ?HENT:  ?   Head: Normocephalic and atraumatic.  ?   Nose: Nose normal.  ?Pulmonary:  ?   Effort: Pulmonary effort is normal.  ?Musculoskeletal:     ?  General: Normal range of motion.  ?   Cervical back: Normal range of motion.  ?Skin: ?   General: Skin is warm and dry.  ?Neurological:  ?   General: No focal deficit present.  ?   Mental Status: He is alert and oriented to person, place, and time.  ? ?Review of Systems  ?Constitutional: Negative.   ?HENT: Negative.    ?Eyes: Negative.   ?Cardiovascular: Negative.   ?Gastrointestinal: Negative.   ?Genitourinary: Negative.   ?Musculoskeletal: Negative.   ?Skin: Negative.   ?Neurological: Negative.   ?Endo/Heme/Allergies: Negative.   ?Psychiatric/Behavioral:    ?     Less depressed, medication compliant.  ?Blood pressure 123/77, pulse 85, temperature 98 ?F (36.7 ?C), temperature source Oral, resp. rate 18, SpO2 98 %. There is no height or weight on file to calculate BMI. ? ? ?Demographic Factors:  ?Male, Adolescent or young adult, Caucasian, and Unemployed ? ?Loss Factors: ?NA ? ?Historical Factors: ?Family history of mental illness or substance abuse ? ?Risk Reduction Factors:   ?Living with another person, especially a relative and Positive therapeutic relationship ? ?Continued Clinical Symptoms:  ?Severe Anxiety and/or  Agitation ?Depression:   Impulsivity ?More than one psychiatric diagnosis ? ?Cognitive Features That Contribute To Risk:  ?None   ? ?Suicide Risk:  ?Mild:  Suicidal ideation of limited frequency, intensity, duration,

## 2022-01-30 NOTE — ED Notes (Signed)
Patient DC off unit to home per provider. Patient alert, cooperative, no s/s of distress. DC information given to patient. Belongings given to patient. Patient ambulatory off unit, escorted by NT. Patient transported by family.  °

## 2023-03-05 ENCOUNTER — Encounter: Payer: Self-pay | Admitting: Emergency Medicine

## 2023-03-05 ENCOUNTER — Ambulatory Visit (INDEPENDENT_AMBULATORY_CARE_PROVIDER_SITE_OTHER): Payer: PRIVATE HEALTH INSURANCE | Admitting: Emergency Medicine

## 2023-03-05 VITALS — BP 118/84 | HR 93 | Temp 98.5°F | Ht 67.0 in | Wt 180.4 lb

## 2023-03-05 DIAGNOSIS — Z13228 Encounter for screening for other metabolic disorders: Secondary | ICD-10-CM | POA: Diagnosis not present

## 2023-03-05 DIAGNOSIS — Z114 Encounter for screening for human immunodeficiency virus [HIV]: Secondary | ICD-10-CM

## 2023-03-05 DIAGNOSIS — Z130 Encounter for screening for diseases of the blood and blood-forming organs and certain disorders involving the immune mechanism: Secondary | ICD-10-CM

## 2023-03-05 DIAGNOSIS — F32A Depression, unspecified: Secondary | ICD-10-CM

## 2023-03-05 DIAGNOSIS — Z1159 Encounter for screening for other viral diseases: Secondary | ICD-10-CM

## 2023-03-05 DIAGNOSIS — Z0000 Encounter for general adult medical examination without abnormal findings: Secondary | ICD-10-CM | POA: Diagnosis not present

## 2023-03-05 DIAGNOSIS — R1031 Right lower quadrant pain: Secondary | ICD-10-CM | POA: Insufficient documentation

## 2023-03-05 DIAGNOSIS — Z7689 Persons encountering health services in other specified circumstances: Secondary | ICD-10-CM

## 2023-03-05 DIAGNOSIS — Z13220 Encounter for screening for lipoid disorders: Secondary | ICD-10-CM | POA: Diagnosis not present

## 2023-03-05 DIAGNOSIS — F419 Anxiety disorder, unspecified: Secondary | ICD-10-CM | POA: Diagnosis not present

## 2023-03-05 DIAGNOSIS — Z1329 Encounter for screening for other suspected endocrine disorder: Secondary | ICD-10-CM

## 2023-03-05 LAB — COMPREHENSIVE METABOLIC PANEL
ALT: 15 U/L (ref 0–53)
AST: 19 U/L (ref 0–37)
Albumin: 4.4 g/dL (ref 3.5–5.2)
Alkaline Phosphatase: 58 U/L (ref 39–117)
BUN: 8 mg/dL (ref 6–23)
CO2: 23 mEq/L (ref 19–32)
Calcium: 9.4 mg/dL (ref 8.4–10.5)
Chloride: 105 mEq/L (ref 96–112)
Creatinine, Ser: 1.07 mg/dL (ref 0.40–1.50)
GFR: 99.11 mL/min (ref >60.00)
Glucose, Bld: 92 mg/dL (ref 70–99)
Potassium: 4.1 mEq/L (ref 3.5–5.1)
Sodium: 140 mEq/L (ref 135–145)
Total Bilirubin: 1 mg/dL (ref 0.2–1.2)
Total Protein: 7.2 g/dL (ref 6.0–8.3)

## 2023-03-05 LAB — CBC WITH DIFFERENTIAL/PLATELET
Basophils Absolute: 0 10*3/uL (ref 0.0–0.1)
Basophils Relative: 0.5 % (ref 0.0–3.0)
Eosinophils Absolute: 0.1 10*3/uL (ref 0.0–0.7)
Eosinophils Relative: 1.2 % (ref 0.0–5.0)
HCT: 42.8 % (ref 39.0–52.0)
Hemoglobin: 14.2 g/dL (ref 13.0–17.0)
Lymphocytes Relative: 21.8 % (ref 12.0–46.0)
Lymphs Abs: 1.5 10*3/uL (ref 0.7–4.0)
MCHC: 33.2 g/dL (ref 30.0–36.0)
MCV: 91.8 fl (ref 78.0–100.0)
Monocytes Absolute: 0.6 10*3/uL (ref 0.1–1.0)
Monocytes Relative: 7.9 % (ref 3.0–12.0)
Neutro Abs: 4.9 10*3/uL (ref 1.4–7.7)
Neutrophils Relative %: 68.6 % (ref 43.0–77.0)
Platelets: 291 10*3/uL (ref 150.0–400.0)
RBC: 4.66 Mil/uL (ref 4.22–5.81)
RDW: 12.3 % (ref 11.5–15.5)
WBC: 7.1 10*3/uL (ref 4.0–10.5)

## 2023-03-05 LAB — LIPID PANEL
Cholesterol: 182 mg/dL (ref 0–200)
HDL: 44.7 mg/dL (ref >39.00)
LDL Cholesterol: 117 mg/dL — ABNORMAL HIGH (ref 0–99)
NonHDL: 136.91
Total CHOL/HDL Ratio: 4
Triglycerides: 102 mg/dL (ref 0.0–149.0)
VLDL: 20.4 mg/dL (ref 0.0–40.0)

## 2023-03-05 LAB — HEMOGLOBIN A1C: Hgb A1c MFr Bld: 5.6 % (ref 4.6–6.5)

## 2023-03-05 NOTE — Patient Instructions (Signed)
Health Maintenance, Male Adopting a healthy lifestyle and getting preventive care are important in promoting health and wellness. Ask your health care provider about: The right schedule for you to have regular tests and exams. Things you can do on your own to prevent diseases and keep yourself healthy. What should I know about diet, weight, and exercise? Eat a healthy diet  Eat a diet that includes plenty of vegetables, fruits, low-fat dairy products, and lean protein. Do not eat a lot of foods that are high in solid fats, added sugars, or sodium. Maintain a healthy weight Body mass index (BMI) is a measurement that can be used to identify possible weight problems. It estimates body fat based on height and weight. Your health care provider can help determine your BMI and help you achieve or maintain a healthy weight. Get regular exercise Get regular exercise. This is one of the most important things you can do for your health. Most adults should: Exercise for at least 150 minutes each week. The exercise should increase your heart rate and make you sweat (moderate-intensity exercise). Do strengthening exercises at least twice a week. This is in addition to the moderate-intensity exercise. Spend less time sitting. Even light physical activity can be beneficial. Watch cholesterol and blood lipids Have your blood tested for lipids and cholesterol at 22 years of age, then have this test every 5 years. You may need to have your cholesterol levels checked more often if: Your lipid or cholesterol levels are high. You are older than 22 years of age. You are at high risk for heart disease. What should I know about cancer screening? Many types of cancers can be detected early and may often be prevented. Depending on your health history and family history, you may need to have cancer screening at various ages. This may include screening for: Colorectal cancer. Prostate cancer. Skin cancer. Lung  cancer. What should I know about heart disease, diabetes, and high blood pressure? Blood pressure and heart disease High blood pressure causes heart disease and increases the risk of stroke. This is more likely to develop in people who have high blood pressure readings or are overweight. Talk with your health care provider about your target blood pressure readings. Have your blood pressure checked: Every 3-5 years if you are 18-39 years of age. Every year if you are 40 years old or older. If you are between the ages of 65 and 75 and are a current or former smoker, ask your health care provider if you should have a one-time screening for abdominal aortic aneurysm (AAA). Diabetes Have regular diabetes screenings. This checks your fasting blood sugar level. Have the screening done: Once every three years after age 45 if you are at a normal weight and have a low risk for diabetes. More often and at a younger age if you are overweight or have a high risk for diabetes. What should I know about preventing infection? Hepatitis B If you have a higher risk for hepatitis B, you should be screened for this virus. Talk with your health care provider to find out if you are at risk for hepatitis B infection. Hepatitis C Blood testing is recommended for: Everyone born from 1945 through 1965. Anyone with known risk factors for hepatitis C. Sexually transmitted infections (STIs) You should be screened each year for STIs, including gonorrhea and chlamydia, if: You are sexually active and are younger than 22 years of age. You are older than 22 years of age and your   health care provider tells you that you are at risk for this type of infection. Your sexual activity has changed since you were last screened, and you are at increased risk for chlamydia or gonorrhea. Ask your health care provider if you are at risk. Ask your health care provider about whether you are at high risk for HIV. Your health care provider  may recommend a prescription medicine to help prevent HIV infection. If you choose to take medicine to prevent HIV, you should first get tested for HIV. You should then be tested every 3 months for as long as you are taking the medicine. Follow these instructions at home: Alcohol use Do not drink alcohol if your health care provider tells you not to drink. If you drink alcohol: Limit how much you have to 0-2 drinks a day. Know how much alcohol is in your drink. In the [place] one drink equals one 12 oz bottle of beer (355 mL), one 5 oz glass of wine (148 mL), or one 1 oz glass of hard liquor (44 mL). Lifestyle Do not use any products that contain nicotine or tobacco. These products include cigarettes, chewing tobacco, and vaping devices, such as e-cigarettes. If you need help quitting, ask your health care provider. Do not use street drugs. Do not share needles. Ask your health care provider for help if you need support or information about quitting drugs. General instructions Schedule regular health, dental, and eye exams. Stay current with your vaccines. Tell your health care provider if: You often feel depressed. You have ever been abused or do not feel safe at home. Summary Adopting a healthy lifestyle and getting preventive care are important in promoting health and wellness. Follow your health care provider's instructions about healthy diet, exercising, and getting tested or screened for diseases. Follow your health care provider's instructions on monitoring your cholesterol and blood pressure. This information is not intended to replace advice given to you by your health care provider. Make sure you discuss any questions you have with your health care provider. Document Revised: 02/05/2021 Document Reviewed: 02/05/2021 Elsevier Patient Education  2024 [employer]  

## 2023-03-05 NOTE — Assessment & Plan Note (Signed)
Chronic.  On no medications at present time Needs psychiatric evaluation Referral placed today.

## 2023-03-05 NOTE — Assessment & Plan Note (Addendum)
Clinically stable.  Unremarkable physical exam. No abnormal findings. No hernias or lymphadenopathy detected. Normal testicular exam.  No masses. Musculoskeletal most likely

## 2023-03-05 NOTE — Progress Notes (Signed)
Vincent Barnett 22 y.o.   Chief Complaint  Patient presents with   New Patient (Initial Visit)    Patient states he has a pain in his groin pain, patient states he has some back pain as well.    mouth issues     Patient states he has a " growth" on left cheek   Referral    Patient wants a referral to psych for some anxiety , generalized.     HISTORY OF PRESENT ILLNESS: This is a 22 y.o. male first visit to this office, here to establish care with me Accompanied by his father Has chronic groin pain Also noticed left sided cheek lymph node Also requesting referral to psychiatrist.  Has history of generalized anxiety disorder No other complaint or medical concerns today.  HPI   Prior to Admission medications   Not on File    No Known Allergies  Patient Active Problem List   Diagnosis Date Noted   Major depressive disorder, recurrent severe without psychotic features (HCC) 01/28/2022   Suicide attempt [facility])    MDD (major depressive disorder) 10/19/2020    Past Medical History:  Diagnosis Date   Allergy    Anxiety    Asthma    Depression     No past surgical history on file.  Social History   Socioeconomic History   Marital status: Single    Spouse name: Not on file   Number of children: Not on file   Years of education: Not on file   Highest education level: Not on file  Occupational History   Not on file  Tobacco Use   Smoking status: Never   Smokeless tobacco: Never  Substance and Sexual Activity   Alcohol use: Not on file   Drug use: Yes    Types: Marijuana    Comment: socially   Sexual activity: Not on file  Other Topics Concern   Not on file  Social History Narrative   Not on file   Social Determinants of Health   Financial Resource Strain: Not on file  Food Insecurity: Not on file  Transportation Needs: Not on file  Physical Activity: Not on file  Stress: Not on file  Social Connections: Not on file  Intimate Partner Violence: Not  on file    Family History  Problem Relation Age of Onset   Mental illness Mother    Heart disease Father    Mental illness Sister    Hypertension Maternal Grandmother    Colon cancer Maternal Grandmother    Hyperlipidemia Maternal Grandfather    Diabetes Paternal Grandmother    Hypertension Paternal Grandmother    Stroke Paternal Grandmother    Cancer Paternal Grandmother      Review of Systems  Constitutional: Negative.  Negative for chills and fever.  HENT: Negative.  Negative for congestion and sore throat.   Respiratory: Negative.  Negative for cough and shortness of breath.   Cardiovascular: Negative.  Negative for chest pain and palpitations.  Gastrointestinal:  Negative for abdominal pain, diarrhea, nausea and vomiting.  Genitourinary: Negative.  Negative for dysuria and hematuria.  Skin: Negative.  Negative for rash.  Neurological: Negative.  Negative for dizziness and headaches.  All other systems reviewed and are negative.   Vitals:   03/05/23 1023  BP: 118/84  Pulse: 93  Temp: 98.5 F (36.9 C)  SpO2: 95%    Physical Exam Vitals reviewed.  Constitutional:      Appearance: Normal appearance.  HENT:  Head: Normocephalic.     Mouth/Throat:     Mouth: Mucous membranes are moist.     Pharynx: Oropharynx is clear.     Comments: Left lower wisdom tooth embedded and not completely out Eyes:     Extraocular Movements: Extraocular movements intact.     Conjunctiva/sclera: Conjunctivae normal.     Pupils: Pupils are equal, round, and reactive to light.  Cardiovascular:     Rate and Rhythm: Normal rate and regular rhythm.     Pulses: Normal pulses.     Heart sounds: Normal heart sounds.  Pulmonary:     Effort: Pulmonary effort is normal.     Breath sounds: Normal breath sounds.  Abdominal:     Palpations: Abdomen is soft.     Tenderness: There is no abdominal tenderness.     Hernia: There is no hernia in the left inguinal area or right inguinal area.   Genitourinary:    Penis: Circumcised.      Testes: Normal.        Right: Mass not present.        Left: Mass not present.     Epididymis:     Right: Normal.     Left: Normal.  Musculoskeletal:     Cervical back: No tenderness.     Right lower leg: No edema.     Left lower leg: No edema.  Lymphadenopathy:     Head:     Left side of head: Submandibular adenopathy present.     Cervical: No cervical adenopathy.     Lower Body: No right inguinal adenopathy. No left inguinal adenopathy.  Skin:    General: Skin is warm and dry.     Capillary Refill: Capillary refill takes less than 2 seconds.  Neurological:     General: No focal deficit present.     Mental Status: He is alert and oriented to person, place, and time.  Psychiatric:        Mood and Affect: Mood normal.        Behavior: Behavior normal.      ASSESSMENT & PLAN: Problem List Items Addressed This Visit       Other   Anxiety and depression    Chronic.  On no medications at present time Needs psychiatric evaluation Referral placed today.      Relevant Orders   Ambulatory referral to Psychiatry   Right inguinal pain    Clinically stable.  Unremarkable physical exam. No abnormal findings. No hernias or lymphadenopathy detected. Normal testicular exam.  No masses. Musculoskeletal most likely      Other Visit Diagnoses     Routine general medical examination at a health care facility    -  Primary   Relevant Orders   CBC with Differential   Comprehensive metabolic panel   Hemoglobin A1c   Lipid panel   HIV antibody   Hepatitis C antibody screen   Encounter to establish care       Encounter for screening for HIV       Relevant Orders   HIV antibody   Need for hepatitis C screening test       Relevant Orders   Hepatitis C antibody screen   Screening for deficiency anemia       Relevant Orders   CBC with Differential   Screening for lipoid disorders       Relevant Orders   Lipid panel   Screening  for endocrine, metabolic and immunity disorder  Relevant Orders   Comprehensive metabolic panel   Hemoglobin A1c      Modifiable risk factors discussed with patient. Anticipatory guidance according to age provided. The following topics were also discussed: Social Determinants of Health Smoking.  Non-smoker Diet and nutrition Benefits of exercise Cancer family history review Vaccinations reviewed and recommendations Cardiovascular risk assessment and need for blood work Mental health including depression and anxiety Fall and accident prevention  Patient Instructions  Health Maintenance, Male Adopting a healthy lifestyle and getting preventive care are important in promoting health and wellness. Ask your health care provider about: The right schedule for you to have regular tests and exams. Things you can do on your own to prevent diseases and keep yourself healthy. What should I know about diet, weight, and exercise? Eat a healthy diet  Eat a diet that includes plenty of vegetables, fruits, low-fat dairy products, and lean protein. Do not eat a lot of foods that are high in solid fats, added sugars, or sodium. Maintain a healthy weight Body mass index (BMI) is a measurement that can be used to identify possible weight problems. It estimates body fat based on height and weight. Your health care provider can help determine your BMI and help you achieve or maintain a healthy weight. Get regular exercise Get regular exercise. This is one of the most important things you can do for your health. Most adults should: Exercise for at least 150 minutes each week. The exercise should increase your heart rate and make you sweat (moderate-intensity exercise). Do strengthening exercises at least twice a week. This is in addition to the moderate-intensity exercise. Spend less time sitting. Even light physical activity can be beneficial. Watch cholesterol and blood lipids Have your blood  tested for lipids and cholesterol at 22 years of age, then have this test every 5 years. You may need to have your cholesterol levels checked more often if: Your lipid or cholesterol levels are high. You are older than 22 years of age. You are at high risk for heart disease. What should I know about cancer screening? Many types of cancers can be detected early and may often be prevented. Depending on your health history and family history, you may need to have cancer screening at various ages. This may include screening for: Colorectal cancer. Prostate cancer. Skin cancer. Lung cancer. What should I know about heart disease, diabetes, and high blood pressure? Blood pressure and heart disease High blood pressure causes heart disease and increases the risk of stroke. This is more likely to develop in people who have high blood pressure readings or are overweight. Talk with your health care provider about your target blood pressure readings. Have your blood pressure checked: Every 3-5 years if you are 72-62 years of age. Every year if you are 55 years old or older. If you are between the ages of 78 and 13 and are a current or former smoker, ask your health care provider if you should have a one-time screening for abdominal aortic aneurysm (AAA). Diabetes Have regular diabetes screenings. This checks your fasting blood sugar level. Have the screening done: Once every three years after age 12 if you are at a normal weight and have a low risk for diabetes. More often and at a younger age if you are overweight or have a high risk for diabetes. What should I know about preventing infection? Hepatitis B If you have a higher risk for hepatitis B, you should be screened for this virus. Talk  with your health care provider to find out if you are at risk for hepatitis B infection. Hepatitis C Blood testing is recommended for: Everyone born from [birth year] through 1965. Anyone with known risk factors for  hepatitis C. Sexually transmitted infections (STIs) You should be screened each year for STIs, including gonorrhea and chlamydia, if: You are sexually active and are younger than 22 years of age. You are older than 22 years of age and your health care provider tells you that you are at risk for this type of infection. Your sexual activity has changed since you were last screened, and you are at increased risk for chlamydia or gonorrhea. Ask your health care provider if you are at risk. Ask your health care provider about whether you are at high risk for HIV. Your health care provider may recommend a prescription medicine to help prevent HIV infection. If you choose to take medicine to prevent HIV, you should first get tested for HIV. You should then be tested every 3 months for as long as you are taking the medicine. Follow these instructions at home: Alcohol use Do not drink alcohol if your health care provider tells you not to drink. If you drink alcohol: Limit how much you have to 0-2 drinks a day. Know how much alcohol is in your drink. In the [place] one drink equals one 12 oz bottle of beer (355 mL), one 5 oz glass of wine (148 mL), or one 1 oz glass of hard liquor (44 mL). Lifestyle Do not use any products that contain nicotine or tobacco. These products include cigarettes, chewing tobacco, and vaping devices, such as e-cigarettes. If you need help quitting, ask your health care provider. Do not use street drugs. Do not share needles. Ask your health care provider for help if you need support or information about quitting drugs. General instructions Schedule regular health, dental, and eye exams. Stay current with your vaccines. Tell your health care provider if: You often feel depressed. You have ever been abused or do not feel safe at home. Summary Adopting a healthy lifestyle and getting preventive care are important in promoting health and wellness. Follow your health care  provider's instructions about healthy diet, exercising, and getting tested or screened for diseases. Follow your health care provider's instructions on monitoring your cholesterol and blood pressure. This information is not intended to replace advice given to you by your health care provider. Make sure you discuss any questions you have with your health care provider. Document Revised: 02/05/2021 Document Reviewed: 02/05/2021 Elsevier Patient Education  2024 [employer].      Edwina Barth, MD [facility]

## 2023-03-06 LAB — HEPATITIS C ANTIBODY: Hepatitis C Ab: NONREACTIVE

## 2023-03-06 LAB — HIV ANTIBODY (ROUTINE TESTING W REFLEX): HIV 1&2 Ab, 4th Generation: NONREACTIVE

## 2023-04-11 ENCOUNTER — Telehealth: Payer: Self-pay | Admitting: Emergency Medicine

## 2023-04-11 ENCOUNTER — Other Ambulatory Visit: Payer: Self-pay | Admitting: *Deleted

## 2023-04-11 DIAGNOSIS — F32A Depression, unspecified: Secondary | ICD-10-CM

## 2023-04-11 NOTE — Telephone Encounter (Signed)
Patient's father called and said that no referral was ever made to psychiatry.  He is extremely upset that this has not been taken care of.  I hung up because he was yelling and screaming at me.

## 2023-04-11 NOTE — Telephone Encounter (Signed)
Called patient and informed him that the behavioral health office tried to call and he didn't answer. I placed a new referral for patient and told him that he should receive a phone call to schedule

## 2023-04-13 IMAGING — CT CT HEAD W/O CM
3 series · 16 of 47 positions shown, 19 images · non-contrast
Comparison: None.

CLINICAL DATA: Head trauma, moderate-severe



[Series 2: head wo · axial · 0.48mm/px · z∈[+1202,+1368]mm · 10 of 39 slices shown, 13 images]
[im 3/39  brain]
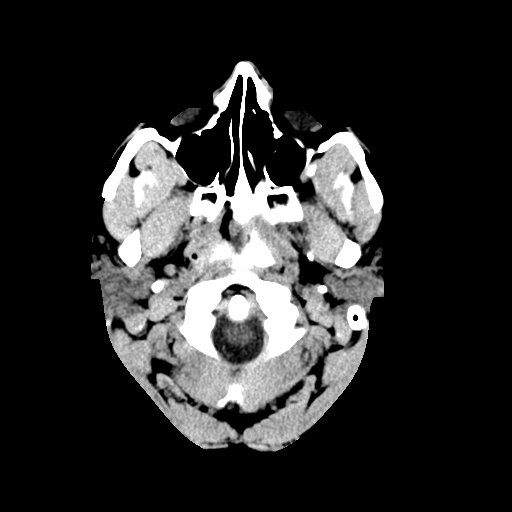
[im 3/39  bone]
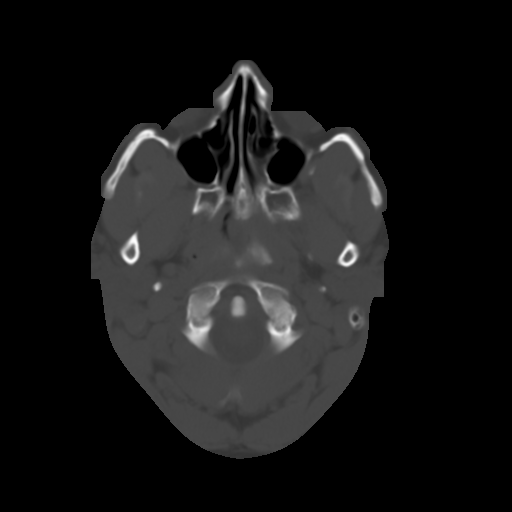
[im 7/39  brain]
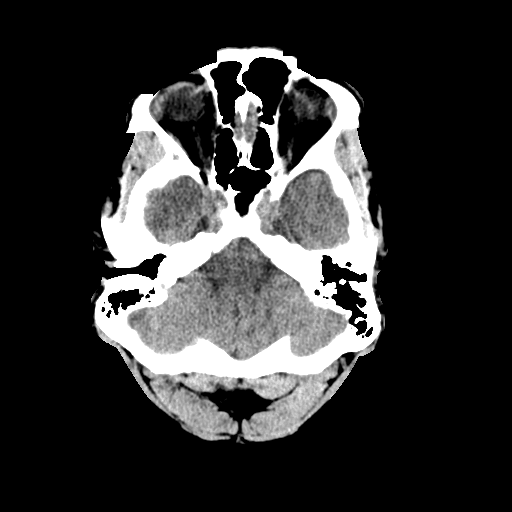
[im 11/39  brain]
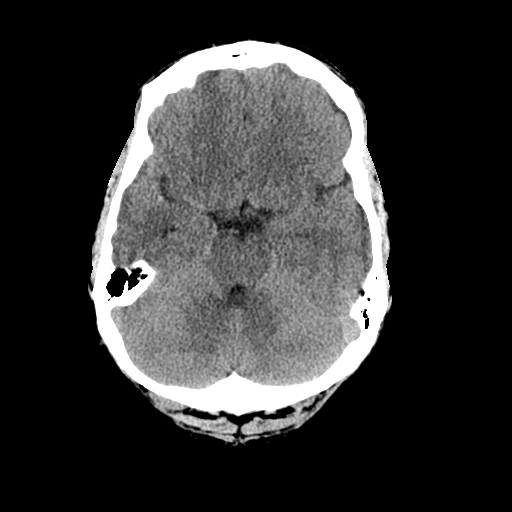
[im 14/39  brain]
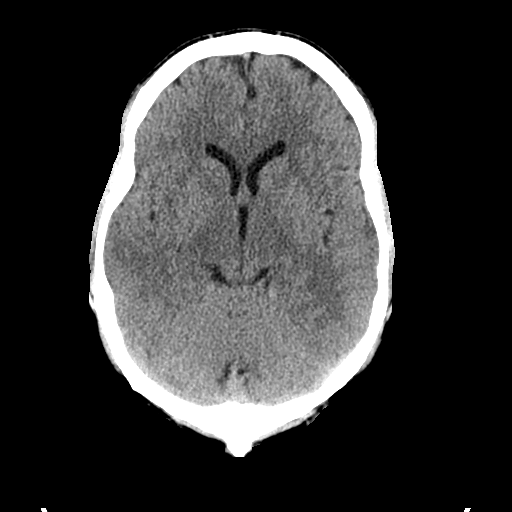
[im 18/39  brain]
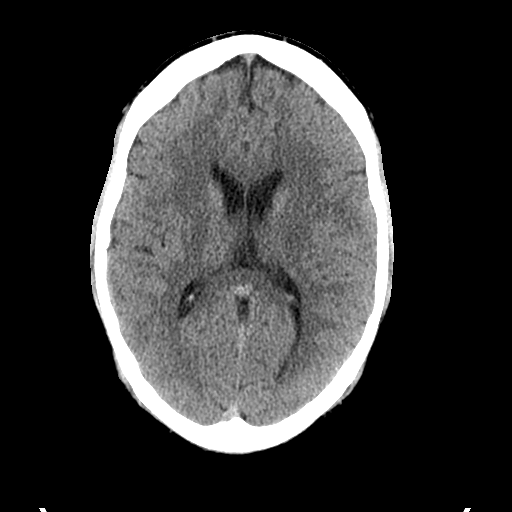
[im 18/39  bone]
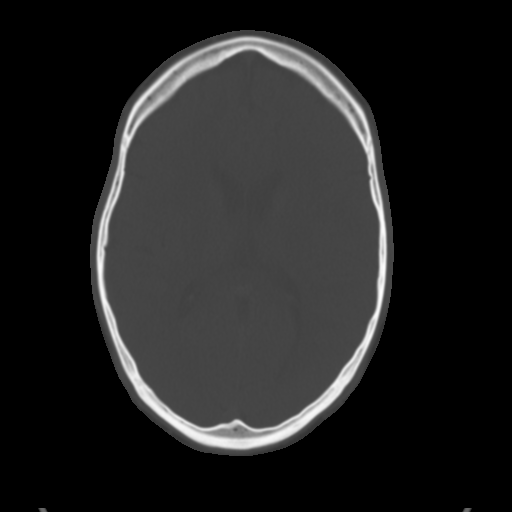
[im 21/39  brain]
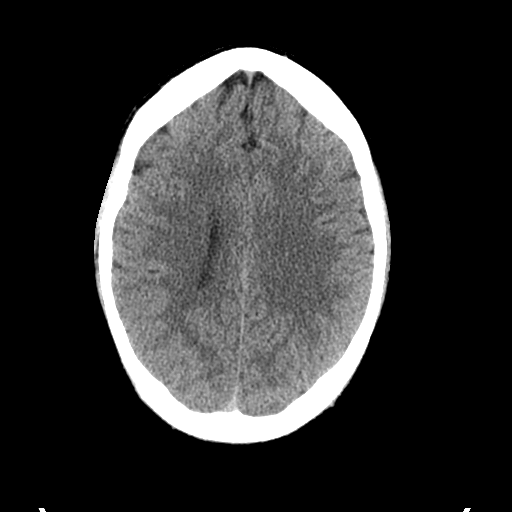
[im 25/39  brain]
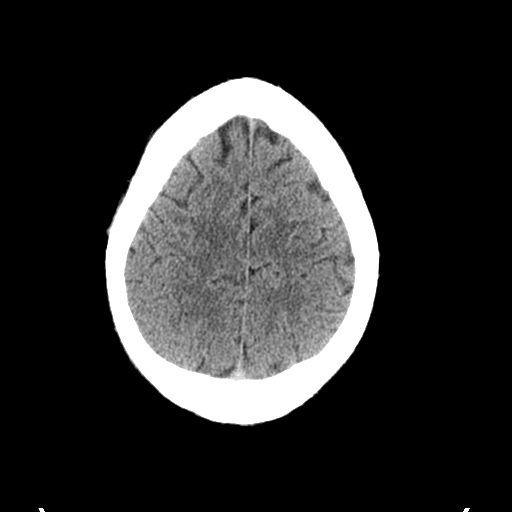
[im 29/39  brain]
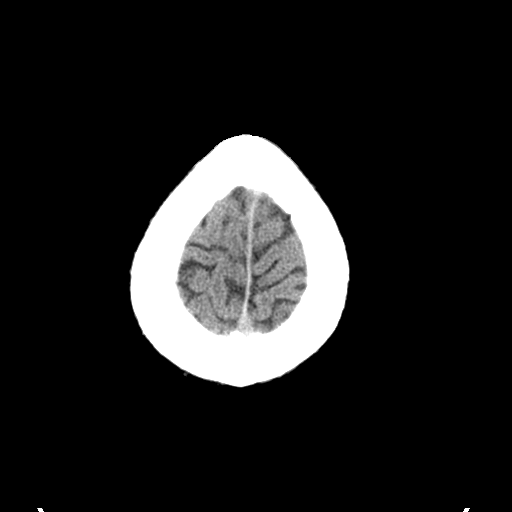
[im 32/39  brain]
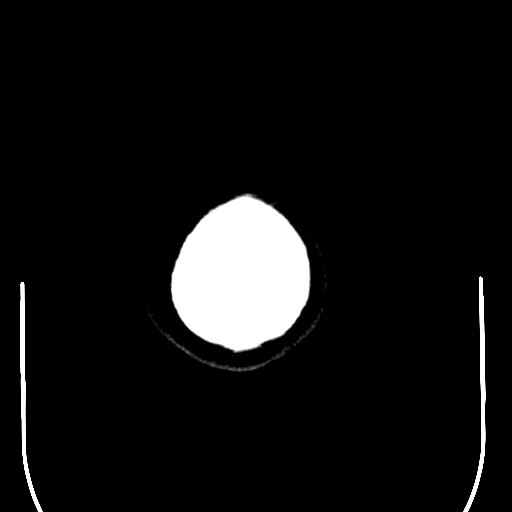
[im 32/39  bone]
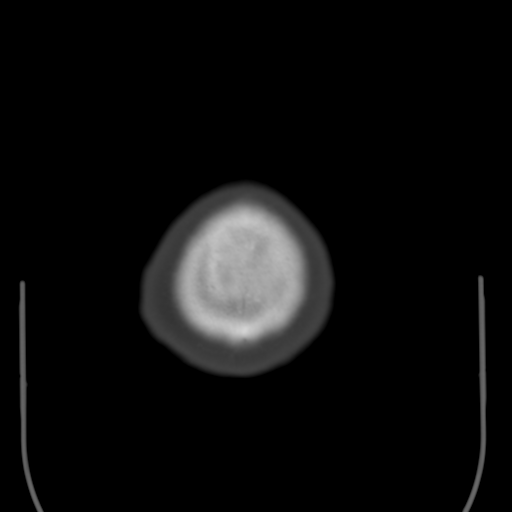
[im 36/39  brain]
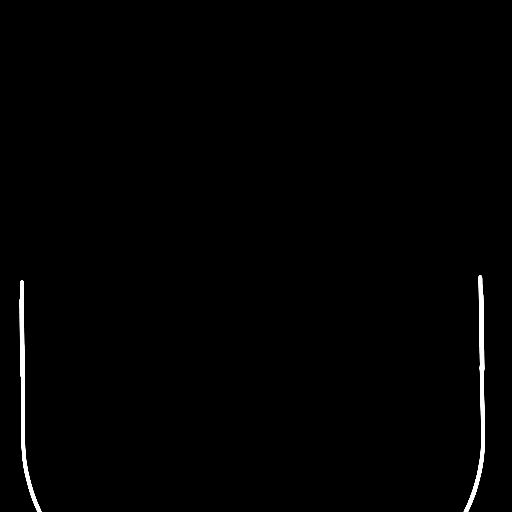

[Series 4: coronal soft tissue · coronal · 0.34mm/px · 3 of 72 slices shown]
[im 24/72  brain]
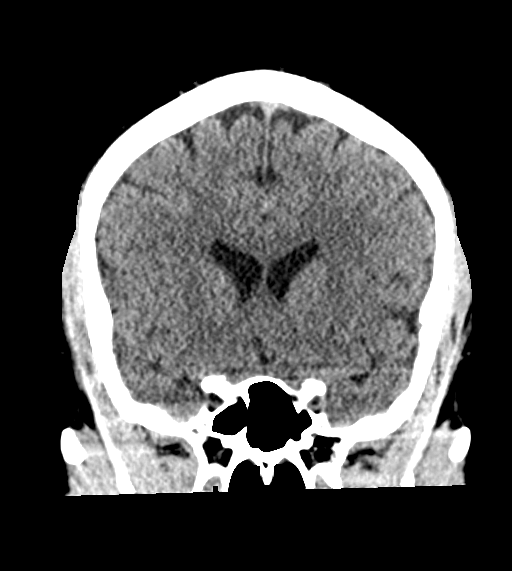
[im 32/72  brain]
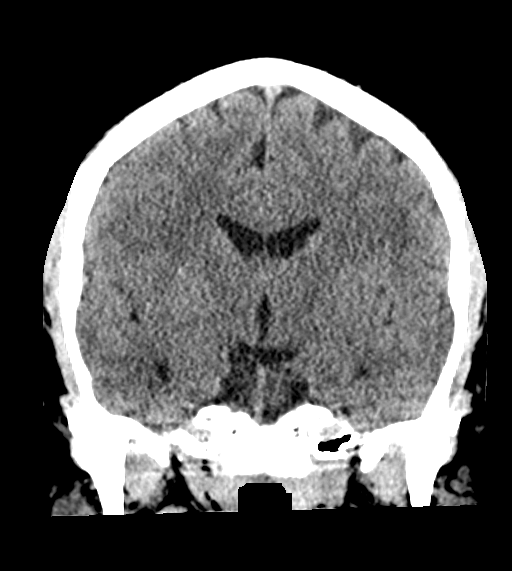
[im 40/72  brain]
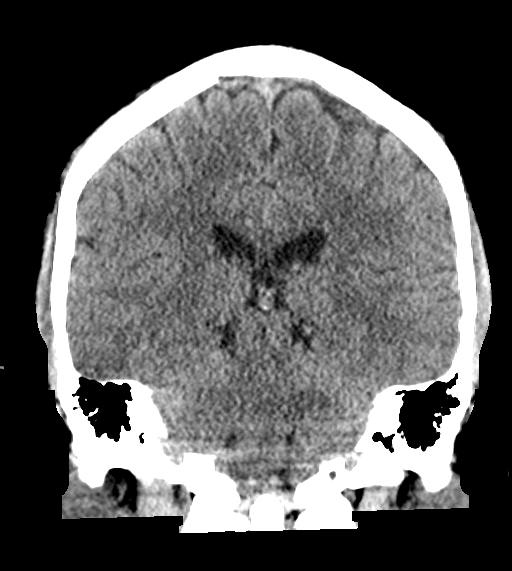

[Series 5: sagittal soft tissue · sagittal · 0.42mm/px · 3 of 58 slices shown]
[im 20/58  brain]
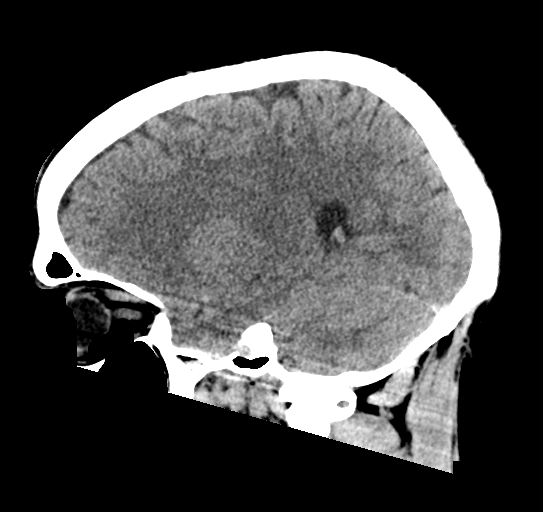
[im 29/58  brain]
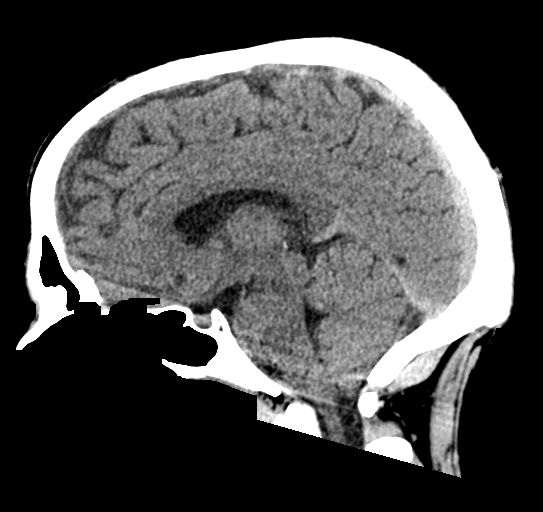
[im 39/58  brain]
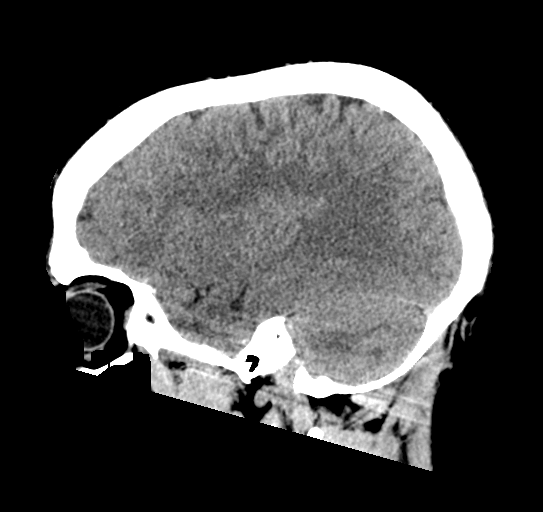

[16 of 47 positions shown; findings below may reference images not displayed]

FINDINGS: Brain: No intracranial hemorrhage, mass effect, or midline shift. No
hydrocephalus. The basilar cisterns are patent. No evidence of
territorial infarct or acute ischemia. No extra-axial or
intracranial fluid collection.

Vascular: No hyperdense vessel or unexpected calcification.

Skull: No fracture or focal lesion.

Sinuses/Orbits: Minimal frothy debris in right side of sphenoid
sinus. No acute findings or fracture. The mastoid air cells are
clear.

Other: None.
IMPRESSION: No acute intracranial abnormality. No skull fracture.

## 2024-03-09 ENCOUNTER — Encounter: Payer: Self-pay | Admitting: Emergency Medicine

## 2024-03-09 ENCOUNTER — Ambulatory Visit (INDEPENDENT_AMBULATORY_CARE_PROVIDER_SITE_OTHER): Payer: PRIVATE HEALTH INSURANCE | Admitting: Emergency Medicine

## 2024-03-09 VITALS — BP 114/62 | HR 77 | Temp 98.4°F | Ht 67.0 in | Wt 183.0 lb

## 2024-03-09 DIAGNOSIS — Z130 Encounter for screening for diseases of the blood and blood-forming organs and certain disorders involving the immune mechanism: Secondary | ICD-10-CM

## 2024-03-09 DIAGNOSIS — Z13220 Encounter for screening for lipoid disorders: Secondary | ICD-10-CM

## 2024-03-09 DIAGNOSIS — Z1329 Encounter for screening for other suspected endocrine disorder: Secondary | ICD-10-CM

## 2024-03-09 DIAGNOSIS — F32A Depression, unspecified: Secondary | ICD-10-CM

## 2024-03-09 DIAGNOSIS — F419 Anxiety disorder, unspecified: Secondary | ICD-10-CM

## 2024-03-09 DIAGNOSIS — M25552 Pain in left hip: Secondary | ICD-10-CM | POA: Insufficient documentation

## 2024-03-09 DIAGNOSIS — M25551 Pain in right hip: Secondary | ICD-10-CM | POA: Insufficient documentation

## 2024-03-09 DIAGNOSIS — Z13228 Encounter for screening for other metabolic disorders: Secondary | ICD-10-CM | POA: Diagnosis not present

## 2024-03-09 DIAGNOSIS — Z0001 Encounter for general adult medical examination with abnormal findings: Secondary | ICD-10-CM

## 2024-03-09 NOTE — Progress Notes (Signed)
 Vincent Barnett 23 y.o.   Chief Complaint  Patient presents with   Annual Exam    Patient here for physical. Patient here with father and has general questions for the provider     HISTORY OF PRESENT ILLNESS: This is a 23 y.o. male here for annual exam. Accompanied by father.  Patient has long history of psychiatric conditions.  May be bipolar disorder.  He is concerned about autism.  Not sure about his diagnosis.  Also been told he has a personality disorder.  Has suicidal ideations on and off for many years.  Needs new referral.  Has not seen a psychiatrist in a while.  Not presently on any medication. Also complaining of chronic bilateral hip and low back pain.  Requesting orthopedic referral. No other complaints or medical concerns today.  HPI   Prior to Admission medications   Not on File    No Known Allergies  Patient Active Problem List   Diagnosis Date Noted   Anxiety and depression 03/05/2023   Major depressive disorder, recurrent severe without psychotic features (HCC) 01/28/2022   MDD (major depressive disorder) 10/19/2020    Past Medical History:  Diagnosis Date   Allergy    Anxiety    Asthma    Depression     No past surgical history on file.  Social History   Socioeconomic History   Marital status: Single    Spouse name: Not on file   Number of children: Not on file   Years of education: Not on file   Highest education level: Not on file  Occupational History   Not on file  Tobacco Use   Smoking status: Never   Smokeless tobacco: Never  Substance and Sexual Activity   Alcohol use: Not on file   Drug use: Yes    Types: Marijuana    Comment: socially   Sexual activity: Not on file  Other Topics Concern   Not on file  Social History Narrative   Not on file   Social Drivers of Health   Financial Resource Strain: Low Risk  (11/19/2021)   Received from [facility], [facility]   Overall Financial Resource Strain (CARDIA)     Difficulty of Paying Living Expenses: Not hard at all  Food Insecurity: No Food Insecurity (11/19/2021)   Received from [facility], [facility]   Hunger Vital Sign    Worried About Running Out of Food in the Last Year: Never true    Ran Out of Food in the Last Year: Never true  Transportation Needs: No Transportation Needs (11/19/2021)   Received from [facility], [facility]   PRAPARE - Transportation    Lack of Transportation (Medical): No    Lack of Transportation (Non-Medical): No  Physical Activity: Inactive (11/19/2021)   Received from [facility], [facility]   Exercise Vital Sign    Days of Exercise per Week: 0 days    Minutes of Exercise per Session: 0 min  Stress: Stress Concern Present (11/19/2021)   Received from [facility], [facility] - Occupational Stress Questionnaire    Feeling of Stress : To some extent  Social Connections: Unknown (02/11/2022)   Received from [facility], [facility]   Social Network    Social Network: Not on file  Recent Concern: Social Connections - Socially Isolated (11/19/2021)   Received from [facility], [facility]   Social Connection and Isolation Panel [NHANES]    Frequency of  Communication with Friends and Family: More than three times a week    Frequency of Social Gatherings with Friends and Family: Three times a week    Attends Religious Services: Never    Active Member of Clubs or Organizations: No    Attends Banker Meetings: Never    Marital Status: Never married  Intimate Partner Violence: Unknown (01/03/2022)   Received from [employer], [facility]   HITS    Physically Hurt: Not on file    Insult or Talk Down To: Not on file    Threaten Physical Harm: Not on file    Scream or Curse: Not on file    Family History  Problem Relation Age of Onset   Mental illness Mother    Heart disease Father    Mental illness Sister    Hypertension Maternal  Grandmother    Colon cancer Maternal Grandmother    Hyperlipidemia Maternal Grandfather    Diabetes Paternal Grandmother    Hypertension Paternal Grandmother    Stroke Paternal Grandmother    Cancer Paternal Grandmother      Review of Systems  Constitutional: Negative.  Negative for chills and fever.  HENT: Negative.  Negative for congestion and sore throat.   Respiratory: Negative.  Negative for cough and shortness of breath.   Cardiovascular: Negative.  Negative for chest pain and palpitations.  Gastrointestinal:  Negative for abdominal pain, diarrhea, nausea and vomiting.  Musculoskeletal:  Positive for back pain.  Skin: Negative.  Negative for rash.  Neurological: Negative.  Negative for dizziness and headaches.  Psychiatric/Behavioral:  Positive for depression. The patient is nervous/anxious.   All other systems reviewed and are negative.   Today's Vitals   03/09/24 0803  BP: 114/62  Pulse: 77  Temp: 98.4 F (36.9 C)  TempSrc: Oral  SpO2: 97%  Weight: 183 lb (83 kg)  Height: 5\' 7"  (1.702 m)   Body mass index is 28.66 kg/m.   Physical Exam HENT:     Head: Normocephalic.     Right Ear: Tympanic membrane, ear canal and external ear normal.     Left Ear: Tympanic membrane, ear canal and external ear normal.     Mouth/Throat:     Mouth: Mucous membranes are moist.     Pharynx: Oropharynx is clear.  Eyes:     Extraocular Movements: Extraocular movements intact.     Conjunctiva/sclera: Conjunctivae normal.     Pupils: Pupils are equal, round, and reactive to light.  Cardiovascular:     Rate and Rhythm: Normal rate and regular rhythm.     Pulses: Normal pulses.     Heart sounds: Normal heart sounds.  Pulmonary:     Effort: Pulmonary effort is normal.     Breath sounds: Normal breath sounds.  Abdominal:     Palpations: Abdomen is soft.     Tenderness: There is no abdominal tenderness.  Musculoskeletal:     Cervical back: No tenderness.  Lymphadenopathy:      Cervical: No cervical adenopathy.  Skin:    General: Skin is warm and dry.  Neurological:     General: No focal deficit present.     Mental Status: He is alert and oriented to person, place, and time.  Psychiatric:        Mood and Affect: Mood normal.        Behavior: Behavior normal.      ASSESSMENT & PLAN: Problem List Items Addressed This Visit       Other  Anxiety and depression   Chronic.  On no medications at present time Needs psychiatric evaluation Referral placed today.      Relevant Orders   Ambulatory referral to Psychiatry   Bilateral hip pain   Relevant Orders   Ambulatory referral to Sports Medicine   Other Visit Diagnoses       Encounter for general adult medical examination with abnormal findings    -  Primary   Relevant Orders   CBC with Differential/Platelet   Comprehensive metabolic panel with GFR   Hemoglobin A1c   Lipid panel     Screening for deficiency anemia       Relevant Orders   CBC with Differential/Platelet     Screening for lipoid disorders       Relevant Orders   Lipid panel     Screening for endocrine, metabolic and immunity disorder       Relevant Orders   Comprehensive metabolic panel with GFR   Hemoglobin A1c   TSH   Vitamin B12   VITAMIN D  25 Hydroxy (Vit-D Deficiency, Fractures)      Modifiable risk factors discussed with patient. Anticipatory guidance according to age provided. The following topics were also discussed: Social Determinants of Health Smoking.  Non-smoker Diet and nutrition Benefits of exercise Cancer family history reviewed Vaccinations review and recommendations Cardiovascular risk assessment Mental health including depression and anxiety and need for psychiatric evaluation.  Referral placed today.  Will need medication. Fall and accident prevention  Patient Instructions  Health Maintenance, Male Adopting a healthy lifestyle and getting preventive care are important in promoting health and  wellness. Ask your health care provider about: The right schedule for you to have regular tests and exams. Things you can do on your own to prevent diseases and keep yourself healthy. What should I know about diet, weight, and exercise? Eat a healthy diet  Eat a diet that includes plenty of vegetables, fruits, low-fat dairy products, and lean protein. Do not eat a lot of foods that are high in solid fats, added sugars, or sodium. Maintain a healthy weight Body mass index (BMI) is a measurement that can be used to identify possible weight problems. It estimates body fat based on height and weight. Your health care provider can help determine your BMI and help you achieve or maintain a healthy weight. Get regular exercise Get regular exercise. This is one of the most important things you can do for your health. Most adults should: Exercise for at least 150 minutes each week. The exercise should increase your heart rate and make you sweat (moderate-intensity exercise). Do strengthening exercises at least twice a week. This is in addition to the moderate-intensity exercise. Spend less time sitting. Even light physical activity can be beneficial. Watch cholesterol and blood lipids Have your blood tested for lipids and cholesterol at 23 years of age, then have this test every 5 years. You may need to have your cholesterol levels checked more often if: Your lipid or cholesterol levels are high. You are older than 23 years of age. You are at high risk for heart disease. What should I know about cancer screening? Many types of cancers can be detected early and may often be prevented. Depending on your health history and family history, you may need to have cancer screening at various ages. This may include screening for: Colorectal cancer. Prostate cancer. Skin cancer. Lung cancer. What should I know about heart disease, diabetes, and high blood pressure? Blood pressure and heart  disease High  blood pressure causes heart disease and increases the risk of stroke. This is more likely to develop in people who have high blood pressure readings or are overweight. Talk with your health care provider about your target blood pressure readings. Have your blood pressure checked: Every 3-5 years if you are 43-50 years of age. Every year if you are 87 years old or older. If you are between the ages of 27 and 37 and are a current or former smoker, ask your health care provider if you should have a one-time screening for abdominal aortic aneurysm (AAA). Diabetes Have regular diabetes screenings. This checks your fasting blood sugar level. Have the screening done: Once every three years after age 66 if you are at a normal weight and have a low risk for diabetes. More often and at a younger age if you are overweight or have a high risk for diabetes. What should I know about preventing infection? Hepatitis B If you have a higher risk for hepatitis B, you should be screened for this virus. Talk with your health care provider to find out if you are at risk for hepatitis B infection. Hepatitis C Blood testing is recommended for: Everyone born from [birth year] through 1965. Anyone with known risk factors for hepatitis C. Sexually transmitted infections (STIs) You should be screened each year for STIs, including gonorrhea and chlamydia, if: You are sexually active and are younger than 23 years of age. You are older than 23 years of age and your health care provider tells you that you are at risk for this type of infection. Your sexual activity has changed since you were last screened, and you are at increased risk for chlamydia or gonorrhea. Ask your health care provider if you are at risk. Ask your health care provider about whether you are at high risk for HIV. Your health care provider may recommend a prescription medicine to help prevent HIV infection. If you choose to take medicine to prevent HIV, you  should first get tested for HIV. You should then be tested every 3 months for as long as you are taking the medicine. Follow these instructions at home: Alcohol use Do not drink alcohol if your health care provider tells you not to drink. If you drink alcohol: Limit how much you have to 0-2 drinks a day. Know how much alcohol is in your drink. In the [place] one drink equals one 12 oz bottle of beer (355 mL), one 5 oz glass of wine (148 mL), or one 1 oz glass of hard liquor (44 mL). Lifestyle Do not use any products that contain nicotine or tobacco. These products include cigarettes, chewing tobacco, and vaping devices, such as e-cigarettes. If you need help quitting, ask your health care provider. Do not use street drugs. Do not share needles. Ask your health care provider for help if you need support or information about quitting drugs. General instructions Schedule regular health, dental, and eye exams. Stay current with your vaccines. Tell your health care provider if: You often feel depressed. You have ever been abused or do not feel safe at home. Summary Adopting a healthy lifestyle and getting preventive care are important in promoting health and wellness. Follow your health care provider's instructions about healthy diet, exercising, and getting tested or screened for diseases. Follow your health care provider's instructions on monitoring your cholesterol and blood pressure. This information is not intended to replace advice given to you by your health care provider. Make sure  you discuss any questions you have with your health care provider. Document Revised: 02/05/2021 Document Reviewed: 02/05/2021 Elsevier Patient Education  2024 Elsevier Inc.     Maryagnes Small, MD [facility]

## 2024-03-09 NOTE — Assessment & Plan Note (Signed)
Chronic.  On no medications at present time Needs psychiatric evaluation Referral placed today.

## 2024-03-09 NOTE — Patient Instructions (Signed)
 Health Maintenance, Male  Adopting a healthy lifestyle and getting preventive care are important in promoting health and wellness. Ask your health care provider about:  The right schedule for you to have regular tests and exams.  Things you can do on your own to prevent diseases and keep yourself healthy.  What should I know about diet, weight, and exercise?  Eat a healthy diet    Eat a diet that includes plenty of vegetables, fruits, low-fat dairy products, and lean protein.  Do not eat a lot of foods that are high in solid fats, added sugars, or sodium.  Maintain a healthy weight  Body mass index (BMI) is a measurement that can be used to identify possible weight problems. It estimates body fat based on height and weight. Your health care provider can help determine your BMI and help you achieve or maintain a healthy weight.  Get regular exercise  Get regular exercise. This is one of the most important things you can do for your health. Most adults should:  Exercise for at least 150 minutes each week. The exercise should increase your heart rate and make you sweat (moderate-intensity exercise).  Do strengthening exercises at least twice a week. This is in addition to the moderate-intensity exercise.  Spend less time sitting. Even light physical activity can be beneficial.  Watch cholesterol and blood lipids  Have your blood tested for lipids and cholesterol at 23 years of age, then have this test every 5 years.  You may need to have your cholesterol levels checked more often if:  Your lipid or cholesterol levels are high.  You are older than 23 years of age.  You are at high risk for heart disease.  What should I know about cancer screening?  Many types of cancers can be detected early and may often be prevented. Depending on your health history and family history, you may need to have cancer screening at various ages. This may include screening for:  Colorectal cancer.  Prostate cancer.  Skin cancer.  Lung  cancer.  What should I know about heart disease, diabetes, and high blood pressure?  Blood pressure and heart disease  High blood pressure causes heart disease and increases the risk of stroke. This is more likely to develop in people who have high blood pressure readings or are overweight.  Talk with your health care provider about your target blood pressure readings.  Have your blood pressure checked:  Every 3-5 years if you are 9-95 years of age.  Every year if you are 85 years old or older.  If you are between the ages of 29 and 29 and are a current or former smoker, ask your health care provider if you should have a one-time screening for abdominal aortic aneurysm (AAA).  Diabetes  Have regular diabetes screenings. This checks your fasting blood sugar level. Have the screening done:  Once every three years after age 23 if you are at a normal weight and have a low risk for diabetes.  More often and at a younger age if you are overweight or have a high risk for diabetes.  What should I know about preventing infection?  Hepatitis B  If you have a higher risk for hepatitis B, you should be screened for this virus. Talk with your health care provider to find out if you are at risk for hepatitis B infection.  Hepatitis C  Blood testing is recommended for:  Everyone born from [birth year] through 1965.  Anyone  with known risk factors for hepatitis C.  Sexually transmitted infections (STIs)  You should be screened each year for STIs, including gonorrhea and chlamydia, if:  You are sexually active and are younger than 23 years of age.  You are older than 23 years of age and your health care provider tells you that you are at risk for this type of infection.  Your sexual activity has changed since you were last screened, and you are at increased risk for chlamydia or gonorrhea. Ask your health care provider if you are at risk.  Ask your health care provider about whether you are at high risk for HIV. Your health care provider  may recommend a prescription medicine to help prevent HIV infection. If you choose to take medicine to prevent HIV, you should first get tested for HIV. You should then be tested every 3 months for as long as you are taking the medicine.  Follow these instructions at home:  Alcohol use  Do not drink alcohol if your health care provider tells you not to drink.  If you drink alcohol:  Limit how much you have to 0-2 drinks a day.  Know how much alcohol is in your drink. In the [place] one drink equals one 12 oz bottle of beer (355 mL), one 5 oz glass of wine (148 mL), or one 1 oz glass of hard liquor (44 mL).  Lifestyle  Do not use any products that contain nicotine or tobacco. These products include cigarettes, chewing tobacco, and vaping devices, such as e-cigarettes. If you need help quitting, ask your health care provider.  Do not use street drugs.  Do not share needles.  Ask your health care provider for help if you need support or information about quitting drugs.  General instructions  Schedule regular health, dental, and eye exams.  Stay current with your vaccines.  Tell your health care provider if:  You often feel depressed.  You have ever been abused or do not feel safe at home.  Summary  Adopting a healthy lifestyle and getting preventive care are important in promoting health and wellness.  Follow your health care provider's instructions about healthy diet, exercising, and getting tested or screened for diseases.  Follow your health care provider's instructions on monitoring your cholesterol and blood pressure.  This information is not intended to replace advice given to you by your health care provider. Make sure you discuss any questions you have with your health care provider.  Document Revised: 02/05/2021 Document Reviewed: 02/05/2021  Elsevier Patient Education  2024 [employer].

## 2024-03-12 NOTE — Progress Notes (Unsigned)
    Ben Jackson D.[facility] [address] [place] [address] Phone: [phone]   Assessment and Plan:     There are no diagnoses linked to this encounter.  ***   Pertinent previous records reviewed include ***    Follow Up: ***     Subjective:    Chief Complaint: bilateral hip pain and left leg pain  HPI:   03/15/24 Patient is a 23 year old male with complain of bilateral hip pain and left leg pain. Patient states  Duration? Did you have an Injury to cause this pain? Taking Medication for pain? Numbness or Tingling? Does the pain Radiate?  Altered gait or use? ROM/ impairment of movement?   Relevant Historical Information: ***  Additional pertinent review of systems negative.  No current outpatient medications on file.   Objective:     There were no vitals filed for this visit.    There is no height or weight on file to calculate BMI.    Physical Exam:    ***   Electronically signed by:  Marshall Skeeter D.[facility] 11:25 AM 03/12/24

## 2024-03-13 ENCOUNTER — Ambulatory Visit: Payer: Self-pay | Admitting: Emergency Medicine

## 2024-03-15 ENCOUNTER — Ambulatory Visit (INDEPENDENT_AMBULATORY_CARE_PROVIDER_SITE_OTHER): Payer: PRIVATE HEALTH INSURANCE | Admitting: Sports Medicine

## 2024-03-15 VITALS — BP 122/60 | HR 91 | Ht 67.0 in | Wt 179.2 lb

## 2024-03-15 DIAGNOSIS — G8929 Other chronic pain: Secondary | ICD-10-CM | POA: Diagnosis not present

## 2024-03-15 DIAGNOSIS — M545 Low back pain, unspecified: Secondary | ICD-10-CM

## 2024-03-15 DIAGNOSIS — M25552 Pain in left hip: Secondary | ICD-10-CM

## 2024-03-15 DIAGNOSIS — M25551 Pain in right hip: Secondary | ICD-10-CM

## 2024-03-15 NOTE — Patient Instructions (Addendum)
-   Start meloxicam 15 mg daily x2 weeks.  If still having pain after 2 weeks, complete 3rd-week of NSAID. May use remaining NSAID as needed once daily for pain control.  Do not to use additional over-the-counter NSAIDs (ibuprofen, naproxen, Advil, Aleve, etc.) while taking prescription NSAIDs.  May use Tylenol  [phone] mg 2 to 3 times a day for breakthrough pain.  Low back and Core HEP. Physical therapy referral to [facility] Physical therapy in [place] Follow up in 6 weeks.

## 2024-04-23 NOTE — Progress Notes (Deleted)
    [facility] [address] [place] [address] Phone: [phone]   Assessment and Plan:     There are no diagnoses linked to this encounter.  ***   Pertinent previous records reviewed include ***    Follow Up: ***     Subjective:   I, Vincent Barnett am a scribe for  Dr. Leonce.    Chief Complaint: bilateral hip pain and left leg pain   HPI:    03/15/24 Patient is a 23 year old male with complain of bilateral hip pain and left leg pain. Patient states it is right sided groin pain that radiates up his back. Feel unbalanced. Constant discomfort that is like a low buzz. Discomfort affects his posterior and his sleep. It keep shim form falling asleep. Feet don't really touch the floor properly.   Duration?years Did you have an Injury to cause this pain? no Taking Medication for pain?no Numbness or Tingling?no Does the pain Radiate? yes Altered gait or use?no ROM/ impairment of movement?yes   04/26/2024 Patient states   Relevant Historical Information: None pertinent  Additional pertinent review of systems negative.  No current outpatient medications on file.   Objective:     There were no vitals filed for this visit.    There is no height or weight on file to calculate BMI.    Physical Exam:    ***   Electronically signed by:  Odis Vincent Barnett [facility] 8:00 AM 04/23/24

## 2024-04-26 ENCOUNTER — Ambulatory Visit: Payer: PRIVATE HEALTH INSURANCE | Admitting: Sports Medicine

## 2024-06-01 ENCOUNTER — Encounter: Payer: Self-pay | Admitting: Sports Medicine
